# Patient Record
Sex: Male | Born: 1985
Health system: Southern US, Community
[De-identification: ages and names within clinical notes are randomized; demographics above are authoritative.]

## PROBLEM LIST (undated history)

## (undated) DIAGNOSIS — F988 Other specified behavioral and emotional disorders with onset usually occurring in childhood and adolescence: Secondary | ICD-10-CM

## (undated) DIAGNOSIS — D4959 Neoplasm of unspecified behavior of other genitourinary organ: Secondary | ICD-10-CM

## (undated) HISTORY — PX: CIRCUMCISION: SUR203

## (undated) HISTORY — PX: WISDOM TOOTH EXTRACTION: SHX21

---
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
## 2011-12-19 ENCOUNTER — Telehealth: Payer: Self-pay

## 2011-12-19 NOTE — Telephone Encounter (Signed)
NEEDS A REFILL ON HIS ADDERALL OR DOES HE NEED TO COME BACK IN FOR A RECHECK

## 2011-12-20 NOTE — Telephone Encounter (Signed)
He needs an OV  

## 2011-12-20 NOTE — Telephone Encounter (Signed)
Spoke to patient to advise he needs appt, he will call back tomorrow to schedule

## 2012-07-31 ENCOUNTER — Encounter: Payer: Self-pay | Admitting: Internal Medicine

## 2012-07-31 ENCOUNTER — Ambulatory Visit (INDEPENDENT_AMBULATORY_CARE_PROVIDER_SITE_OTHER): Payer: 59 | Admitting: Internal Medicine

## 2012-07-31 VITALS — BP 100/62 | HR 70 | Temp 98.3°F | Resp 16 | Ht 71.5 in | Wt 189.8 lb

## 2012-07-31 DIAGNOSIS — F988 Other specified behavioral and emotional disorders with onset usually occurring in childhood and adolescence: Secondary | ICD-10-CM | POA: Insufficient documentation

## 2012-07-31 MED ORDER — AMPHETAMINE-DEXTROAMPHETAMINE 20 MG PO TABS: 20.0000 mg | ORAL_TABLET | Freq: Every day | ORAL | Status: DC

## 2012-07-31 MED ORDER — AMPHETAMINE-DEXTROAMPHETAMINE 20 MG PO TABS: 20.0000 mg | ORAL_TABLET | Freq: Two times a day (BID) | ORAL | Status: DC

## 2012-07-31 NOTE — Addendum Note (Signed)
Addended by: Tonye Pearson on: 07/31/2012 04:40 PM   Modules accepted: Orders

## 2012-07-31 NOTE — Progress Notes (Signed)
  Subjective:    Patient ID: Christopher Swanson, male    DOB: 13-Nov-1985, 27 y.o.   MRN: [id number]  HPI    Review of Systems  Constitutional: Negative.   HENT: Negative.   Eyes: Negative.   Respiratory: Negative.   Cardiovascular: Negative.   Gastrointestinal: Negative.   Genitourinary: Negative.   Musculoskeletal: Negative.   Skin: Negative.   Neurological: Negative.   Hematological: Negative.   Psychiatric/Behavioral: Negative.        Objective:   Physical Exam        Assessment & Plan:

## 2012-07-31 NOTE — Progress Notes (Signed)
  Subjective:    Patient ID: Christopher Swanson, male    DOB: 08/05/1985, 27 y.o.   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 ADD (attention deficit disorder)  amphetamine-dextroamphetamine (ADDERALL) 20 MG tablet, amphetamine-dextroamphetamine (ADDERALL) 20 MG tablet, amphetamine-dextroamphetamine (ADDERALL) 20 MG tablet   Meds ordered this encounter  Medications  . amphetamine-dextroamphetamine (ADDERALL) 20 MG tablet    Sig: Take 1 tablet (20 mg total) by mouth daily.    Dispense:  30 tablet    Refill:  0  . amphetamine-dextroamphetamine (ADDERALL) 20 MG tablet    Sig: Take 1 tablet (20 mg total) by mouth 2 (two) times daily. For after 08/31/12    Dispense:  30 tablet    Refill:  0  . amphetamine-dextroamphetamine (ADDERALL) 20 MG tablet    Sig: Take 1 tablet (20 mg total) by mouth 2 (two) times daily. For after 09/28/12    Dispense:  30 tablet    Refill:  0   Call for 3 and f/u 6 mos

## 2012-07-31 NOTE — Progress Notes (Signed)
Completed prior auth for pt's Adderall 20 mg over the phone and received approval from 07/01/12-07/31/2013. Faxed approval notice to pharmacy.

## 2012-11-26 ENCOUNTER — Telehealth: Payer: Self-pay

## 2012-11-26 DIAGNOSIS — F988 Other specified behavioral and emotional disorders with onset usually occurring in childhood and adolescence: Secondary | ICD-10-CM

## 2012-11-26 MED ORDER — AMPHETAMINE-DEXTROAMPHETAMINE 20 MG PO TABS: 20.0000 mg | ORAL_TABLET | Freq: Every day | ORAL | Status: DC

## 2012-11-26 NOTE — Telephone Encounter (Signed)
Pended please advise.  

## 2012-11-26 NOTE — Telephone Encounter (Signed)
Ref Meds ordered this encounter  Medications  . amphetamine-dextroamphetamine (ADDERALL) 20 MG tablet    Sig: Take 1 tablet (20 mg total) by mouth daily.    Dispense:  30 tablet    Refill:  0  . amphetamine-dextroamphetamine (ADDERALL) 20 MG tablet    Sig: Take 1 tablet (20 mg total) by mouth daily. For after 12/29/12    Dispense:  30 tablet    Refill:  0

## 2012-11-26 NOTE — Telephone Encounter (Signed)
Patient has recently moved to [place] area.  He will be in [place] next week.  The patient would like to know if Dr.  Merla Riches can write up a couple of prescriptions to last him for a couple of months.

## 2012-11-27 NOTE — Telephone Encounter (Signed)
Patient advised.

## 2012-12-05 ENCOUNTER — Ambulatory Visit
Admission: RE | Admit: 2012-12-05 | Discharge: 2012-12-05 | Disposition: A | Discharge status: Home / Self Care | Payer: 59 | Source: Ambulatory Visit | Attending: Internal Medicine | Admitting: Internal Medicine

## 2012-12-05 ENCOUNTER — Other Ambulatory Visit: Payer: Self-pay | Admitting: Internal Medicine

## 2012-12-05 DIAGNOSIS — N509 Disorder of male genital organs, unspecified: Secondary | ICD-10-CM

## 2012-12-05 IMAGING — US US SCROTUM
1 series · 14 of 25 positions shown · non-contrast
Comparison: None.

CLINICAL DATA: Right testicular firmness.  Possible lump.

ULTRASOUND OF SCROTUM
TECHNIQUE: Complete ultrasound examination of the testicles,
epididymis, and other scrotal structures was performed.

[Series 1: us scrotum · 0.08mm/px · 14 of 49 slices shown]
[im 1/49]
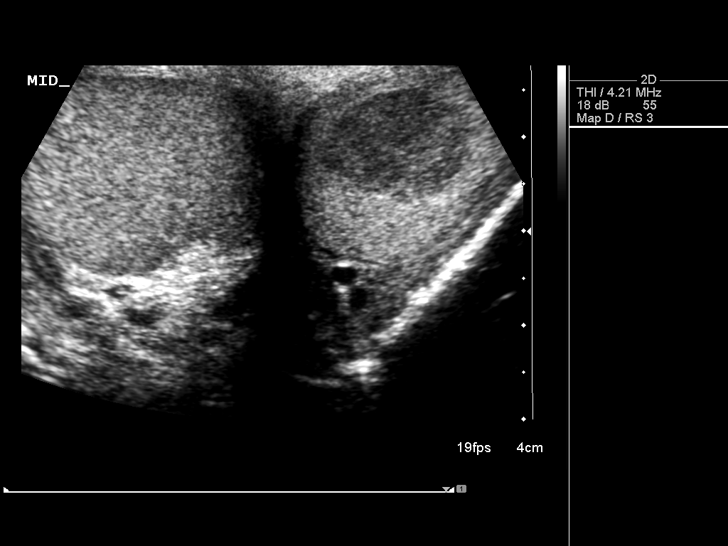
[im 5/49]
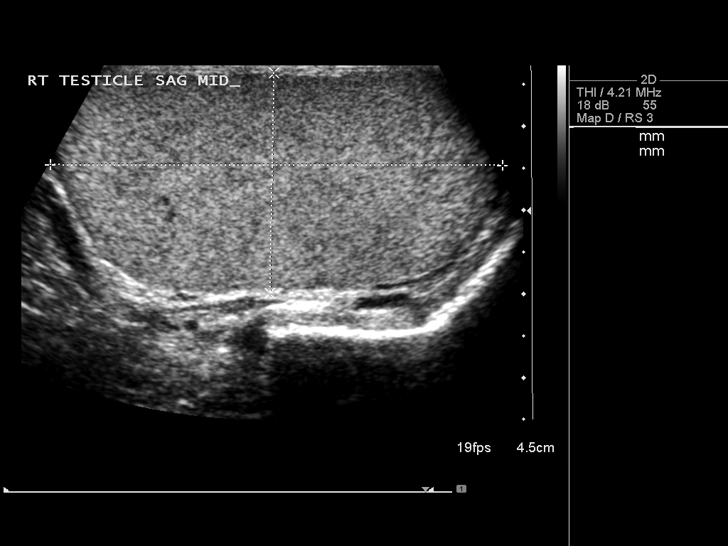
[im 9/49]
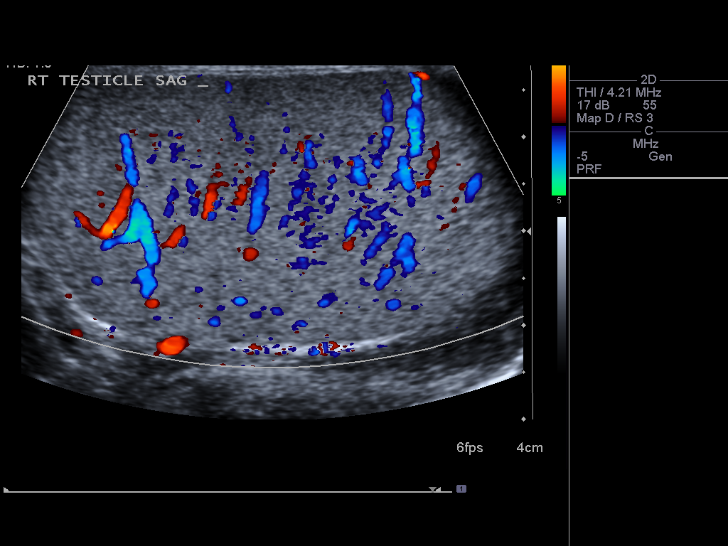
[im 13/49]
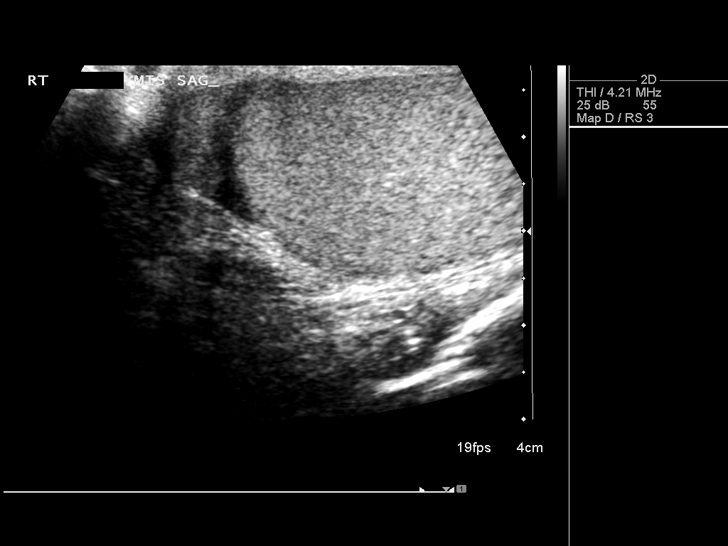
[im 17/49]
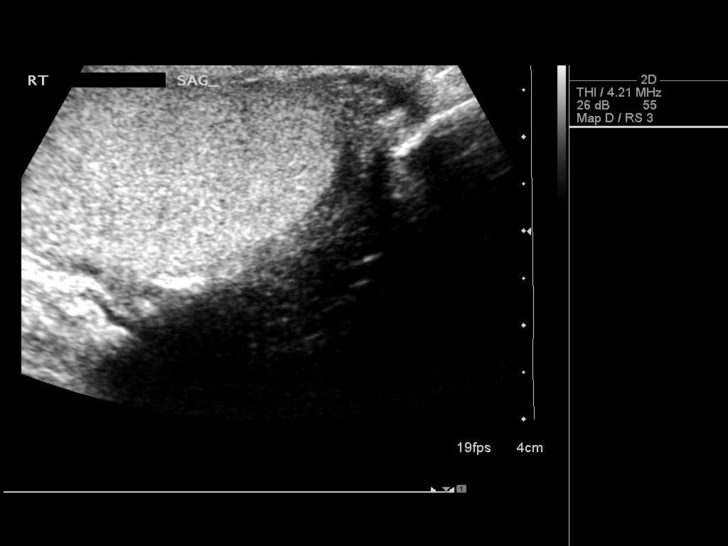
[im 19/49]
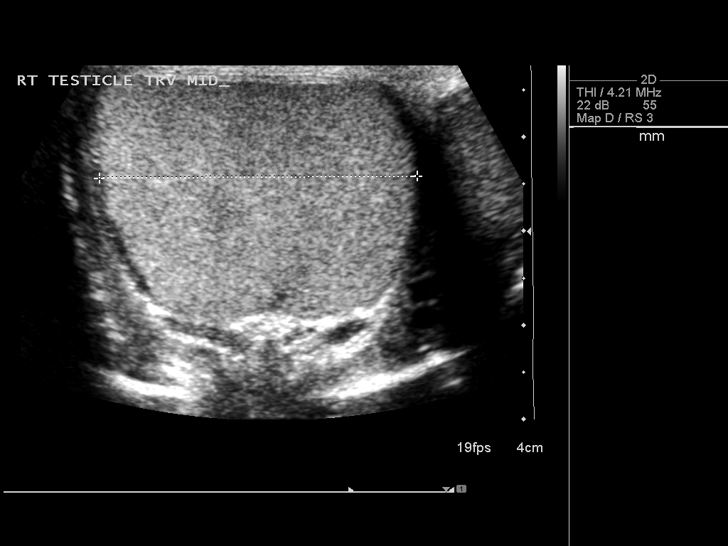
[im 23/49]
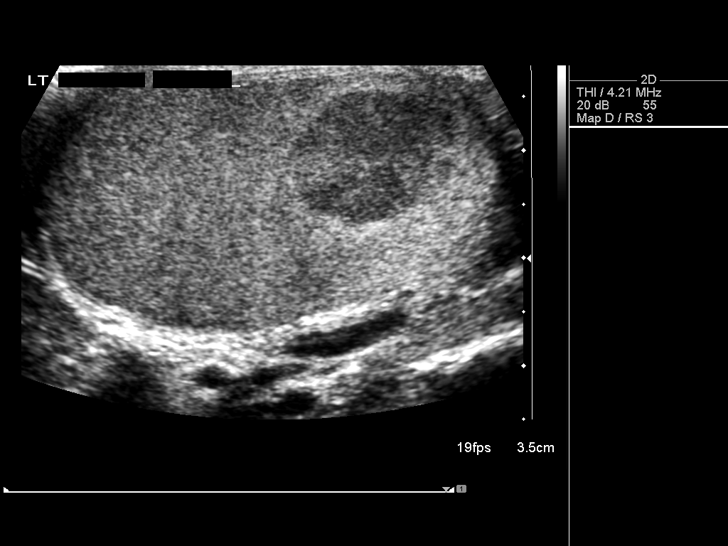
[im 27/49]
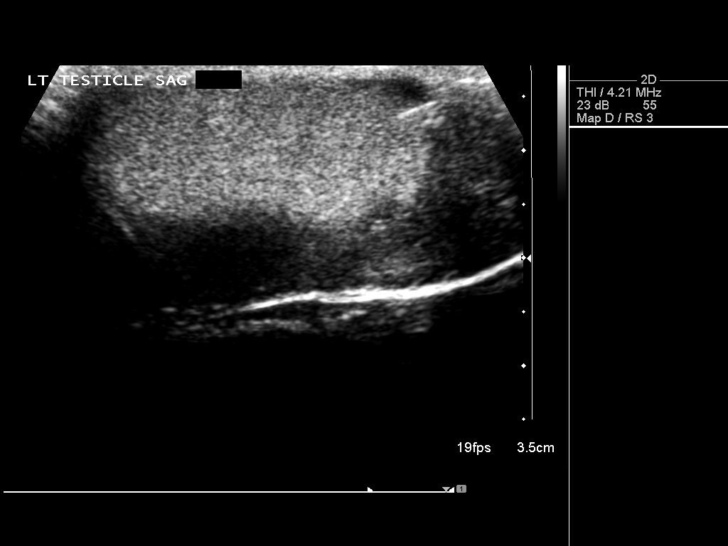
[im 31/49]
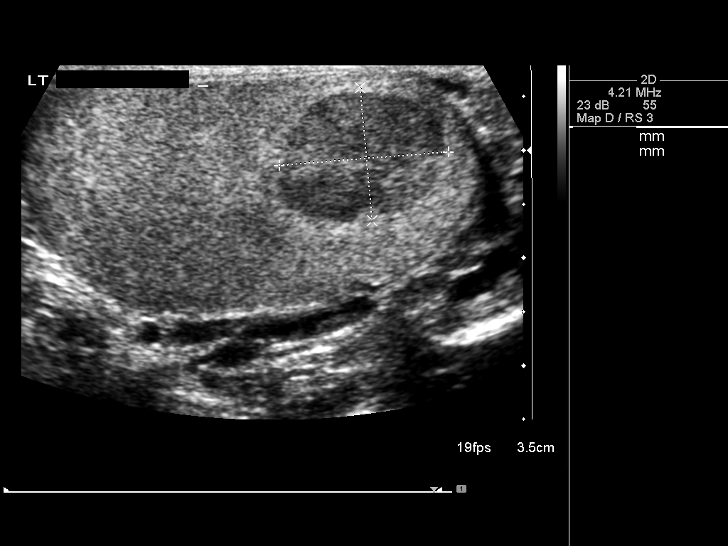
[im 33/49]
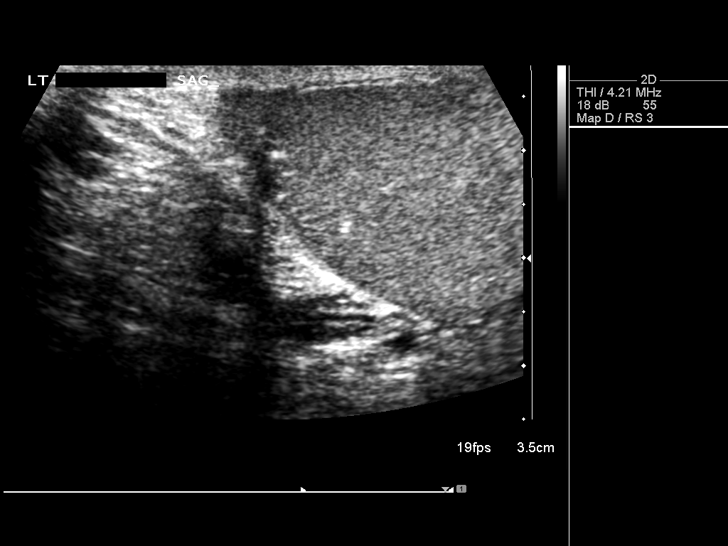
[im 37/49]
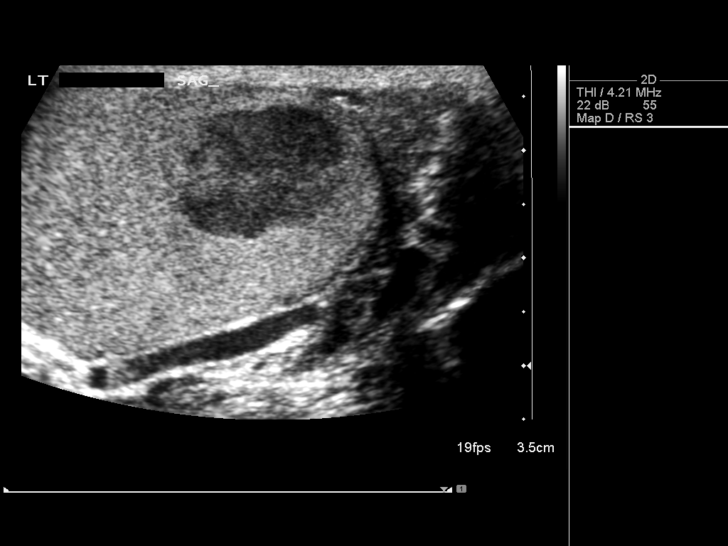
[im 41/49]
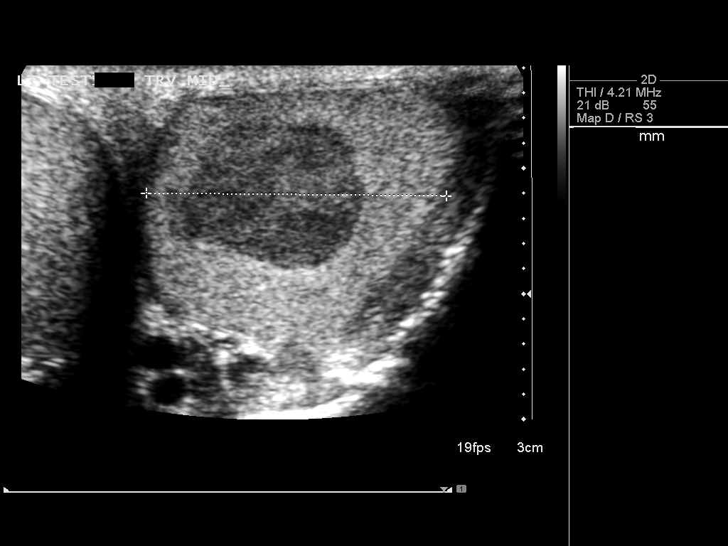
[im 45/49]
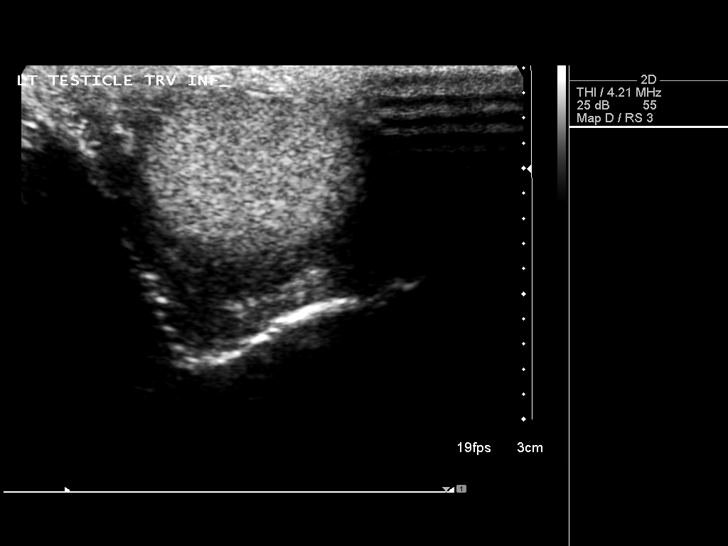
[im 49/49]
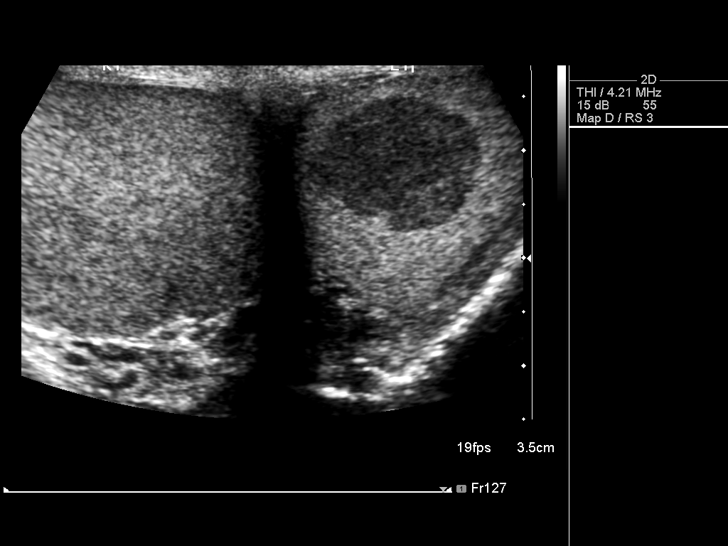

[14 of 25 positions shown; findings below may reference images not displayed]

FINDINGS: Right testis:  Measures 5.4 x 2.6 x 3.4 cm with expected internal
color flow

Left testis:  Measures 4.3 x 2.3 x 2.4 cm and contains an
intratesticular 1.6 x 1.2 x 1.5 cm hypoechoic solid mass with
hypervascularity.  There are some scattered calcifications along
the margin of the left testicle.

Right epididymis:  Normal in size and appearance.

Left epididymis:  Normal in size and appearance.

Hydrocele:  present/absent.

Varicocele:  present/absent.
IMPRESSION: 1. The right testicle is larger than left, and there is a sharply
defined hypervascular hypoechoic solid mass in the LEFT testicle.
Appearance concerning for potentially malignant left testicular
tumor.  Specific note is made that the lesion is in the left
testicle and not the right. Urology referral recommended for
potential orchiectomy.

## 2012-12-09 ENCOUNTER — Other Ambulatory Visit: Payer: Self-pay | Admitting: Urology

## 2012-12-10 ENCOUNTER — Encounter (HOSPITAL_BASED_OUTPATIENT_CLINIC_OR_DEPARTMENT_OTHER): Payer: Self-pay | Admitting: *Deleted

## 2012-12-10 NOTE — Progress Notes (Signed)
NPO AFTER MN. ARRIVES AT 0830. NEEDS HG. 

## 2012-12-13 NOTE — H&P (Signed)
History of Present Illness  [name]) presents today as a consultation request from Dr. Eula Listen for further assessment and treatment of a left testicular mass. He is currently 27 years of age. No other urologic history or issues. He was in recently to see Dr. Eula Listen for a small abnormality on his leg. He also reported questionable firm mass in his right testicle relative to his left. A scrotal ultrasound was performed which did not reveal any right-sided testicular abnormality but the patient clearly had a well-circumscribed approximately 1.6 cm abnormality in the left testis. This clearly showed hypervascularity it was quite concerning for primary germ cell tumor. No prior history of cryptorchidism.   Past Medical History Problems  1. History of  ADHD, Combined Type 314.01  Current Meds 1. Adderall 20 MG Oral Tablet; Therapy: (Recorded:02Jun2014) to  Allergies Medication  1. No Known Drug Allergies  Social History Problems    Alcohol Use   Marital History - Single   Occupation: Denied    History of  Tobacco Use  Review of Systems Genitourinary, constitutional, skin, eye, otolaryngeal, hematologic/lymphatic, cardiovascular, pulmonary, endocrine, musculoskeletal, gastrointestinal, neurological and psychiatric system(s) were reviewed and pertinent findings if present are noted.  Genitourinary: nocturia and difficulty starting the urinary stream.  Integumentary: skin rash/lesion and pruritus.  Hematologic/Lymphatic: a tendency to easily bruise.  Musculoskeletal: back pain.  Psychiatric: anxiety and depression.    Vitals Vital Signs [Data Includes: Last 1 Day]  02Jun2014 10:24AM  BMI Calculated: 25.73 BSA Calculated: 2.09 Height: 6 ft  Weight: 190 lb  Blood Pressure: 125 / 84 Temperature: 98.5 F Heart Rate: 62  Physical Exam Constitutional: Well nourished and well developed . No acute distress.  ENT:. The ears and nose are normal in appearance.  Neck: The  appearance of the neck is normal and no neck mass is present.  Pulmonary: No respiratory distress and normal respiratory rhythm and effort.  Cardiovascular: Heart rate and rhythm are normal . No peripheral edema.  Abdomen: The abdomen is soft and nontender. No masses are palpated. No CVA tenderness. No hernias are palpable. No hepatosplenomegaly noted.  Genitourinary: Examination of the penis demonstrates no discharge, no masses, no lesions and a normal meatus. The scrotum is without lesions. The right epididymis is palpably normal and non-tender. The left epididymis is palpably normal and non-tender. The right testis is non-tender and without masses. The left testis is found to have a 2 cm mass, but non-tender.  Skin: Normal skin turgor, no visible rash and no visible skin lesions.  Neuro/Psych:. Mood and affect are appropriate.    Results/Data Urine [Data Includes: Last 1 Day]   02Jun2014  COLOR YELLOW   APPEARANCE CLEAR   SPECIFIC GRAVITY 1.015   pH 8.5   GLUCOSE NEG mg/dL  BILIRUBIN NEG   KETONE NEG mg/dL  BLOOD NEG   PROTEIN NEG mg/dL  UROBILINOGEN 0.2 mg/dL  NITRITE NEG   LEUKOCYTE ESTERASE NEG    Assessment Assessed  1. Probable  Testicular Cancer 186.9  Plan Health Maintenance (V70.0)  1. UA With REFLEX  Done: 02Jun2014 10:02AM Testicular Cancer (186.9)  2. ALPHA-FETOPROTIEN (TUMOR MARKER)  Requested for: 02Jun2014 3. BETA HCG TUMOR MARKER  Requested for: 02Jun2014 4. LDH  Requested for: 02Jun2014 5. Follow-up Schedule Surgery Office  Follow-up  Requested for: 02Jun2014  Discussion/Summary   [name] actually presented to Dr. Donette Larry with a concerning palpable abnormality in the left testicle. Ultrasound confirms a well circumscribed 1.5 to 2 cm abnormality consistent with probable testicular neoplasm.  There is increased blood flow to this area. I suspect this is likely to be a seminoma but certainly very likely to be a germ cell tumor nonetheless. I have spoken to Rumeal and  his father today. I have recommended a left inguinal exploration/radical orchiectomy. Annie is interested in testicular prosthesis simultaneously, which is quite reasonable. Marker studies today to be drawn with alpha-fetoprotein, beta-hCG and LDH levels. Subsequent to surgery, if germ cell tumor is confirmed, we will plan on staging CT of the abdomen, pelvis, and chest. We will attempt to arrange surgery for the next 7-10 days.   cc: Dr. Donette Larry    Signatures Electronically signed by : Barron Alvine, M.D.; Dec 09 2012 12:54PM

## 2012-12-16 ENCOUNTER — Ambulatory Visit (HOSPITAL_BASED_OUTPATIENT_CLINIC_OR_DEPARTMENT_OTHER): Payer: 59 | Admitting: Anesthesiology

## 2012-12-16 ENCOUNTER — Encounter (HOSPITAL_BASED_OUTPATIENT_CLINIC_OR_DEPARTMENT_OTHER): Payer: Self-pay

## 2012-12-16 ENCOUNTER — Ambulatory Visit (HOSPITAL_BASED_OUTPATIENT_CLINIC_OR_DEPARTMENT_OTHER)
Admission: RE | Admit: 2012-12-16 | Discharge: 2012-12-16 | Disposition: A | Discharge status: Home / Self Care | Payer: 59 | Source: Ambulatory Visit | Attending: Urology | Admitting: Urology

## 2012-12-16 ENCOUNTER — Encounter (HOSPITAL_BASED_OUTPATIENT_CLINIC_OR_DEPARTMENT_OTHER)
Admission: RE | Disposition: A | Discharge status: Home / Self Care | Payer: Self-pay | Source: Ambulatory Visit | Attending: Urology

## 2012-12-16 ENCOUNTER — Encounter (HOSPITAL_BASED_OUTPATIENT_CLINIC_OR_DEPARTMENT_OTHER): Payer: Self-pay | Admitting: Anesthesiology

## 2012-12-16 DIAGNOSIS — Z79899 Other long term (current) drug therapy: Secondary | ICD-10-CM | POA: Insufficient documentation

## 2012-12-16 DIAGNOSIS — C6290 Malignant neoplasm of unspecified testis, unspecified whether descended or undescended: Secondary | ICD-10-CM | POA: Insufficient documentation

## 2012-12-16 DIAGNOSIS — F909 Attention-deficit hyperactivity disorder, unspecified type: Secondary | ICD-10-CM | POA: Insufficient documentation

## 2012-12-16 HISTORY — DX: Neoplasm of unspecified behavior of other genitourinary organ: D49.59

## 2012-12-16 HISTORY — PX: TESTICULAR PROSTHETIC INSERTION: SHX491

## 2012-12-16 HISTORY — DX: Other specified behavioral and emotional disorders with onset usually occurring in childhood and adolescence: F98.8

## 2012-12-16 HISTORY — PX: ORCHIECTOMY: SHX2116

## 2012-12-16 SURGERY — ORCHIECTOMY
Anesthesia: General | Site: Scrotum | Laterality: Left | Wound class: Clean

## 2012-12-16 MED ORDER — CEFAZOLIN SODIUM 1-5 GM-% IV SOLN
1.0000 g | INTRAVENOUS | Status: DC
  Filled 2012-12-16: qty 50

## 2012-12-16 MED ORDER — GLYCOPYRROLATE 0.2 MG/ML IJ SOLN
INTRAMUSCULAR | Status: DC | PRN
  Administered 2012-12-16 (×2): 0.2 mg via INTRAVENOUS

## 2012-12-16 MED ORDER — PROPOFOL 10 MG/ML IV BOLUS
INTRAVENOUS | Status: DC | PRN
  Administered 2012-12-16: 200 mg via INTRAVENOUS

## 2012-12-16 MED ORDER — ONDANSETRON HCL 4 MG/2ML IJ SOLN
INTRAMUSCULAR | Status: DC | PRN
  Administered 2012-12-16: 4 mg via INTRAVENOUS

## 2012-12-16 MED ORDER — CEFAZOLIN SODIUM-DEXTROSE 2-3 GM-% IV SOLR
2.0000 g | INTRAVENOUS | Status: AC
  Administered 2012-12-16: 2 g via INTRAVENOUS
  Filled 2012-12-16: qty 50

## 2012-12-16 MED ORDER — SODIUM CHLORIDE 0.9 % IR SOLN
Status: DC | PRN
  Administered 2012-12-16: 10:00:00

## 2012-12-16 MED ORDER — DEXAMETHASONE SODIUM PHOSPHATE 4 MG/ML IJ SOLN
INTRAMUSCULAR | Status: DC | PRN
  Administered 2012-12-16: 8 mg via INTRAVENOUS

## 2012-12-16 MED ORDER — BUPIVACAINE HCL (PF) 0.25 % IJ SOLN
INTRAMUSCULAR | Status: DC | PRN
  Administered 2012-12-16: 16 mL

## 2012-12-16 MED ORDER — FENTANYL CITRATE 0.05 MG/ML IJ SOLN
INTRAMUSCULAR | Status: DC | PRN
  Administered 2012-12-16: 25 ug via INTRAVENOUS
  Administered 2012-12-16 (×3): 50 ug via INTRAVENOUS
  Administered 2012-12-16: 25 ug via INTRAVENOUS

## 2012-12-16 MED ORDER — HYDROCODONE-ACETAMINOPHEN 5-325 MG PO TABS: 1.0000 | ORAL_TABLET | Freq: Four times a day (QID) | ORAL | Status: DC | PRN

## 2012-12-16 MED ORDER — CEPHALEXIN 250 MG PO CAPS: 250.0000 mg | ORAL_CAPSULE | Freq: Three times a day (TID) | ORAL | Status: DC

## 2012-12-16 MED ORDER — LIDOCAINE HCL (CARDIAC) 20 MG/ML IV SOLN
INTRAVENOUS | Status: DC | PRN
  Administered 2012-12-16: 50 mg via INTRAVENOUS

## 2012-12-16 MED ORDER — FENTANYL CITRATE 0.05 MG/ML IJ SOLN
25.0000 ug | INTRAMUSCULAR | Status: DC | PRN
  Administered 2012-12-16 (×2): 25 ug via INTRAVENOUS
  Filled 2012-12-16: qty 1

## 2012-12-16 MED ORDER — LACTATED RINGERS IV SOLN
INTRAVENOUS | Status: DC
  Administered 2012-12-16 (×2): via INTRAVENOUS
  Filled 2012-12-16: qty 1000

## 2012-12-16 MED ORDER — LACTATED RINGERS IV SOLN
INTRAVENOUS | Status: DC
  Filled 2012-12-16: qty 1000

## 2012-12-16 MED ORDER — MIDAZOLAM HCL 5 MG/5ML IJ SOLN
INTRAMUSCULAR | Status: DC | PRN
  Administered 2012-12-16: 2 mg via INTRAVENOUS

## 2012-12-16 MED ORDER — HYDROCODONE-ACETAMINOPHEN 5-325 MG PO TABS
1.0000 | ORAL_TABLET | Freq: Four times a day (QID) | ORAL | Status: AC | PRN
  Administered 2012-12-16: 1 via ORAL
  Filled 2012-12-16: qty 1

## 2012-12-16 SURGICAL SUPPLY — 62 items
ADH SKN CLS APL DERMABOND .7 (GAUZE/BANDAGES/DRESSINGS) ×2
APL SKNCLS STERI-STRIP NONHPOA (GAUZE/BANDAGES/DRESSINGS) ×4
BANDAGE GAUZE ELAST BULKY 4 IN (GAUZE/BANDAGES/DRESSINGS) ×3 IMPLANT
BENZOIN TINCTURE PRP APPL 2/3 (GAUZE/BANDAGES/DRESSINGS) ×2 IMPLANT
BLADE SURG 15 STRL LF DISP TIS (BLADE) ×2 IMPLANT
BLADE SURG 15 STRL SS (BLADE) ×3
BLADE SURG ROTATE 9660 (MISCELLANEOUS) ×3 IMPLANT
CANISTER SUCTION 2500CC (MISCELLANEOUS) ×1 IMPLANT
CLEANER CAUTERY TIP 5X5 PAD (MISCELLANEOUS) ×2 IMPLANT
CLOTH BEACON ORANGE TIMEOUT ST (SAFETY) ×3 IMPLANT
COVER MAYO STAND STRL (DRAPES) ×3 IMPLANT
COVER TABLE BACK 60X90 (DRAPES) ×3 IMPLANT
DERMABOND ADVANCED (GAUZE/BANDAGES/DRESSINGS) ×1
DERMABOND ADVANCED .7 DNX12 (GAUZE/BANDAGES/DRESSINGS) IMPLANT
DISSECTOR ROUND CHERRY 3/8 STR (MISCELLANEOUS) ×1 IMPLANT
DRAIN PENROSE 18X1/4 LTX STRL (WOUND CARE) ×1 IMPLANT
DRAPE LAPAROTOMY TRNSV 102X78 (DRAPE) IMPLANT
DRAPE PED LAPAROTOMY (DRAPES) ×3 IMPLANT
DRSG TEGADERM 4X4.75 (GAUZE/BANDAGES/DRESSINGS) ×1 IMPLANT
ELECT NDL TIP 2.8 STRL (NEEDLE) ×2 IMPLANT
ELECT NEEDLE TIP 2.8 STRL (NEEDLE) ×3 IMPLANT
ELECT REM PT RETURN 9FT ADLT (ELECTROSURGICAL) ×3
ELECTRODE REM PT RTRN 9FT ADLT (ELECTROSURGICAL) ×2 IMPLANT
GLOVE BIO SURGEON STRL SZ 6.5 (GLOVE) ×1 IMPLANT
GLOVE BIO SURGEON STRL SZ7.5 (GLOVE) ×4 IMPLANT
GLOVE ECLIPSE 6.5 STRL STRAW (GLOVE) ×1 IMPLANT
GLOVE ECLIPSE 7.0 STRL STRAW (GLOVE) ×1 IMPLANT
GLOVE INDICATOR 7.0 STRL GRN (GLOVE) ×1 IMPLANT
GOWN PREVENTION PLUS LG XLONG (DISPOSABLE) ×3 IMPLANT
GOWN STRL NON-REIN LRG LVL3 (GOWN DISPOSABLE) ×2 IMPLANT
NDL HYPO 25X1 1.5 SAFETY (NEEDLE) ×2 IMPLANT
NEEDLE HYPO 25X1 1.5 SAFETY (NEEDLE) ×3 IMPLANT
NS IRRIG 500ML POUR BTL (IV SOLUTION) IMPLANT
PACK BASIN DAY SURGERY FS (CUSTOM PROCEDURE TRAY) ×3 IMPLANT
PAD CLEANER CAUTERY TIP 5X5 (MISCELLANEOUS) ×1
PENCIL BUTTON HOLSTER BLD 10FT (ELECTRODE) ×3 IMPLANT
PROSTHESIS TESTICULAR NAC MED (Urological Implant) IMPLANT
STRIP CLOSURE SKIN 1/4X4 (GAUZE/BANDAGES/DRESSINGS) ×2 IMPLANT
SUPPORT SCROTAL LG STRP (MISCELLANEOUS) ×3 IMPLANT
SUT SILK 0 SH 30 (SUTURE) ×6 IMPLANT
SUT SILK 0 TIES 10X30 (SUTURE) ×3 IMPLANT
SUT VIC AB 2-0 CT1 27 (SUTURE)
SUT VIC AB 2-0 CT1 TAPERPNT 27 (SUTURE) IMPLANT
SUT VIC AB 3-0 CT1 36 (SUTURE) IMPLANT
SUT VIC AB 3-0 SH 27 (SUTURE) ×6
SUT VIC AB 3-0 SH 27X BRD (SUTURE) ×4 IMPLANT
SUT VIC AB 4-0 BRD 54 (SUTURE) ×1 IMPLANT
SUT VIC AB 4-0 P-3 18XBRD (SUTURE) IMPLANT
SUT VIC AB 4-0 P3 18 (SUTURE)
SUT VIC AB 4-0 RB1 18 (SUTURE) IMPLANT
SUT VIC AB 5-0 P-3 18X BRD (SUTURE) IMPLANT
SUT VIC AB 5-0 P3 18 (SUTURE)
SUT VICRYL 4-0 PS2 18IN ABS (SUTURE) ×3 IMPLANT
SYR BULB IRRIGATION 50ML (SYRINGE) ×1 IMPLANT
SYR CONTROL 10ML LL (SYRINGE) ×3 IMPLANT
TESTICULAR PROSTHESIS NAC MED (Urological Implant) ×3 IMPLANT
TOWEL OR 17X24 6PK STRL BLUE (TOWEL DISPOSABLE) ×6 IMPLANT
TRAY DSU PREP LF (CUSTOM PROCEDURE TRAY) ×3 IMPLANT
TUBE CONNECTING 12X1/4 (SUCTIONS) ×1 IMPLANT
TUBING SUCTION BULK 100 FT (MISCELLANEOUS) IMPLANT
WATER STERILE IRR 500ML POUR (IV SOLUTION) IMPLANT
YANKAUER SUCT BULB TIP NO VENT (SUCTIONS) ×1 IMPLANT

## 2012-12-16 NOTE — Interval H&P Note (Signed)
History and Physical Interval Note:  12/16/2012 9:40 AM  Christopher Swanson  has presented today for surgery, with the diagnosis of LEFT TESTICULAR TUMOR  The various methods of treatment have been discussed with the patient and family. After consideration of risks, benefits and other options for treatment, the patient has consented to  Procedure(s) with comments: ORCHIECTOMY (Left) - 1 HR  INSERTION TESTICULAR PROSTHESIS (Left) as a surgical intervention .  The patient's history has been reviewed, patient examined, no change in status, stable for surgery.  I have reviewed the patient's chart and labs.  Questions were answered to the patient's satisfaction.     Melaysia Streed S

## 2012-12-16 NOTE — Anesthesia Preprocedure Evaluation (Signed)

## 2012-12-16 NOTE — Anesthesia Postprocedure Evaluation (Signed)
  Anesthesia Post-op Note  Patient: Christopher Swanson  Procedure(s) Performed: Procedure(s) (LRB): ORCHIECTOMY (Left) INSERTION TESTICULAR PROSTHESIS (Left)  Patient Location: PACU  Anesthesia Type: General  Level of Consciousness: awake and alert   Airway and Oxygen Therapy: Patient Spontanous Breathing  Post-op Pain: mild  Post-op Assessment: Post-op Vital signs reviewed, Patient's Cardiovascular Status Stable, Respiratory Function Stable, Patent Airway and No signs of Nausea or vomiting  Last Vitals:  Filed Vitals:   12/16/12 1215  BP: 121/76  Pulse: 82  Temp:   Resp: 13    Post-op Vital Signs: stable   Complications: No apparent anesthesia complications

## 2012-12-16 NOTE — Transfer of Care (Signed)
Immediate Anesthesia Transfer of Care Note  Patient: Christopher Swanson  Procedure(s) Performed: Procedure(s) (LRB): ORCHIECTOMY (Left) INSERTION TESTICULAR PROSTHESIS (Left)  Patient Location: PACU  Anesthesia Type: General  Level of Consciousness: awake, oriented, sedated and patient cooperative  Airway & Oxygen Therapy: Patient Spontanous Breathing and Patient connected to face mask oxygen  Post-op Assessment: Report given to PACU RN and Post -op Vital signs reviewed and stable  Post vital signs: Reviewed and stable  Complications: No apparent anesthesia complications

## 2012-12-16 NOTE — Op Note (Signed)
Preoperative diagnosis: Left testicular tumor Postoperative diagnosis: Same  Procedure: Left radical orchiectomy   Surgeon: Valetta Fuller M.D.  Anesthesia: Gen.  Indications: Christopher Swanson is 27 years of age and presented recently to his primary care physician with a questionable abnormality in the left testicle. Ultrasound was performed which confirmed approximately a 1.5 cm hypoechoic abnormality that was hypervascular consistent with a testicular cancer. This was palpable on clinical exam. All markers were negative. The patient has not had any CT imaging at this point. The rationale for inguinal radical orchiectomy was discussed with the patient and his father at the time of consultation. Full informed consent obtained. The patient did request testicular prosthesis.     Technique and findings: Patient was brought the operating room where he had successful induction of general anesthesia. He was prepped and draped in the usual manner. He received perioperative Ancef and placement of PAS compression boots. A standard operative timeout was performed. Left inguinal incision was performed. As carried down to the superficial fascia to the external oblique. The external ring was identified and the external oblique fascia then opened in the direction of the fibers. Underlying ilioinguinal nerve was identified and preserved. The spermatic cord was encircled at the level of the pubic tubercle and a Penrose drain placed around the spermatic cord. This was then dissected free from the floor of the inguinal canal. A tourniquet was then placed on the proximal spermatic cord. The testis was then delivered and gubernacular attachments were taken down with electrocautery. A palpable mass in the inferior pole of the testis was identified. There is no evidence of tumor breaking out through that tunica vaginalis. The spermatic cord was then taken in 2 bundles at the level of the internal range. Silk suture and silk suture ligature  were utilized for double ligation proximally in both bundles. The wound was then copiously irrigated. The wound was infiltrated with quarter percent Marcaine.   Attention was then turned to placement of a medium saline filled testicular prosthesis. This was filled and the standard manner. It was anchored to the inferior aspect of the scrotum utilizing a single Prolene suture. Again copious irrigation was performed. External oblique was then closed with running 2-0 Vicryl suture. Superficial fascia closed with 3-0 Vicryl the skin closed with a subcuticular 4-0 Vicryl. The patient had no obvious complications or problems. Blood loss was minimal. He was brought to recovery room in stable condition.

## 2012-12-16 NOTE — Anesthesia Procedure Notes (Signed)
Procedure Name: LMA Insertion Date/Time: 12/16/2012 9:48 AM Performed by: Renella Cunas D Pre-anesthesia Checklist: Patient identified, Emergency Drugs available, Suction available and Patient being monitored Patient Re-evaluated:Patient Re-evaluated prior to inductionOxygen Delivery Method: Circle System Utilized Preoxygenation: Pre-oxygenation with 100% oxygen Intubation Type: IV induction Ventilation: Mask ventilation without difficulty LMA: LMA inserted LMA Size: 4.0 Number of attempts: 1 Airway Equipment and Method: bite block Placement Confirmation: positive ETCO2 Tube secured with: Tape Dental Injury: Teeth and Oropharynx as per pre-operative assessment

## 2012-12-17 ENCOUNTER — Encounter (HOSPITAL_BASED_OUTPATIENT_CLINIC_OR_DEPARTMENT_OTHER): Payer: Self-pay | Admitting: Urology

## 2013-01-12 ENCOUNTER — Other Ambulatory Visit: Payer: Self-pay | Admitting: Radiology

## 2013-01-12 MED ORDER — AMPHETAMINE-DEXTROAMPHETAMINE 20 MG PO TABS: 20.0000 mg | ORAL_TABLET | Freq: Every day | ORAL | Status: DC

## 2013-01-12 MED ORDER — AMPHETAMINE-DEXTROAMPHETAMINE 20 MG PO TABS: 20.0000 mg | ORAL_TABLET | ORAL | Status: DC | PRN

## 2013-01-12 NOTE — Telephone Encounter (Signed)
Pt came in office to pick up rx-nothing was located in the box nor was their a signature documented for pick up. Discussed with Dr Stephanie Acre checked the database and ok'd for Adderall per [facility]'s notes. Pt picked up rx today.

## 2013-02-04 ENCOUNTER — Ambulatory Visit: Payer: 59 | Admitting: Physical Therapy

## 2013-02-05 ENCOUNTER — Ambulatory Visit: Payer: 59 | Attending: Nurse Practitioner

## 2013-02-05 DIAGNOSIS — M545 Low back pain, unspecified: Secondary | ICD-10-CM | POA: Insufficient documentation

## 2013-02-05 DIAGNOSIS — IMO0001 Reserved for inherently not codable concepts without codable children: Secondary | ICD-10-CM | POA: Insufficient documentation

## 2013-02-11 ENCOUNTER — Ambulatory Visit: Payer: 59 | Attending: Nurse Practitioner

## 2013-02-11 DIAGNOSIS — M545 Low back pain, unspecified: Secondary | ICD-10-CM | POA: Insufficient documentation

## 2013-02-11 DIAGNOSIS — IMO0001 Reserved for inherently not codable concepts without codable children: Secondary | ICD-10-CM | POA: Insufficient documentation

## 2013-02-18 ENCOUNTER — Ambulatory Visit: Payer: 59

## 2013-02-19 ENCOUNTER — Ambulatory Visit (INDEPENDENT_AMBULATORY_CARE_PROVIDER_SITE_OTHER): Payer: 59 | Admitting: Internal Medicine

## 2013-02-19 ENCOUNTER — Encounter: Payer: Self-pay | Admitting: Internal Medicine

## 2013-02-19 VITALS — BP 123/74 | HR 68 | Temp 98.3°F | Resp 16 | Ht 72.5 in | Wt 191.8 lb

## 2013-02-19 DIAGNOSIS — F988 Other specified behavioral and emotional disorders with onset usually occurring in childhood and adolescence: Secondary | ICD-10-CM

## 2013-02-19 MED ORDER — AMPHETAMINE-DEXTROAMPHETAMINE 10 MG PO TABS: 15.0000 mg | ORAL_TABLET | Freq: Every day | ORAL | Status: DC

## 2013-02-19 NOTE — Progress Notes (Signed)
  Subjective:    Patient ID: Christopher Swanson, male    DOB: 1985-11-04, 27 y.o.   MRN: [id number]  HPI here for followup for attention deficit disorder  Patient interested in decreasing Adderall, hoping to wean off in the next year. 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 Does not feel the need to use this medication for work duties. This was expected due to the mild nature of his attention deficit disorder  Had testicular cancer with surgery this summer by Dr. Isabel Caprice. No evidence of metastatics disease. Has f/u in one week to check hormone levels.  Has good support system in girlfriend. Has new job with [employer] in [place]. Having change in insurance and will transfer care to Dr. Rene Paci who is in his network, requests record transfer.     Continues to be healthy despite testicular cancer and is now resuming his exercise pattern  Review of Systems Essentially negative    Objective:   Physical Exam BP 123/74  Pulse 68  Temp(Src) 98.3 F (36.8 C) (Oral)  Resp 16  Ht 6' 0.5" (1.842 m)  Wt 191 lb 12.8 oz (87 kg)  BMI 25.64 kg/m2  SpO2 97% HEENT clear Heart regular Cranial nerve II through XII intact Cerebellar intact Mood good/affect appropriate      Assessment & Plan:  ADD- will decrease Adderall gradually as tolerated until discontinued over the next few months  Meds ordered this encounter  Medications  . amphetamine-dextroamphetamine (ADDERALL) 10 MG tablet    Sig: Take 1.5 tablets (15 mg total) by mouth daily.    Dispense:  45 tablet    Refill:  0  . amphetamine-dextroamphetamine (ADDERALL) 10 MG tablet    Sig: Take 1.5 tablets (15 mg total) by mouth daily.    Dispense:  45 tablet    Refill:  0  . amphetamine-dextroamphetamine (ADDERALL) 10 MG tablet    Sig: Take 1.5 tablets (15 mg total) by mouth daily.    Dispense:  45 tablet    Refill:  0   To Start routine care with  Dr. Rene Paci

## 2013-02-19 NOTE — Progress Notes (Deleted)
  Subjective:    Patient ID: Christopher Swanson, male    DOB: 01-Sep-1985, 27 y.o.   MRN: [id number]  HPI    Review of Systems     Objective:   Physical Exam        Assessment & Plan:  Notes to Dr Rene Paci

## 2013-02-25 ENCOUNTER — Ambulatory Visit: Payer: 59

## 2013-03-04 ENCOUNTER — Ambulatory Visit: Payer: 59

## 2013-03-11 ENCOUNTER — Ambulatory Visit: Payer: BC Managed Care – PPO | Attending: Nurse Practitioner

## 2013-03-11 DIAGNOSIS — M545 Low back pain, unspecified: Secondary | ICD-10-CM | POA: Insufficient documentation

## 2013-03-11 DIAGNOSIS — IMO0001 Reserved for inherently not codable concepts without codable children: Secondary | ICD-10-CM | POA: Insufficient documentation

## 2013-08-27 ENCOUNTER — Ambulatory Visit
Admission: RE | Admit: 2013-08-27 | Discharge: 2013-08-27 | Disposition: A | Discharge status: Home / Self Care | Payer: BC Managed Care – PPO | Source: Ambulatory Visit | Attending: Urology | Admitting: Urology

## 2013-08-27 ENCOUNTER — Other Ambulatory Visit: Payer: Self-pay | Admitting: Urology

## 2013-08-27 DIAGNOSIS — Z8547 Personal history of malignant neoplasm of testis: Secondary | ICD-10-CM

## 2013-08-27 IMAGING — CR DG CHEST 2V
2 series · 2 of 2 positions shown · non-contrast
Comparison: None.

CLINICAL DATA: Testicular cancer

EXAM:
CHEST  2 VIEW

[w chest pa]
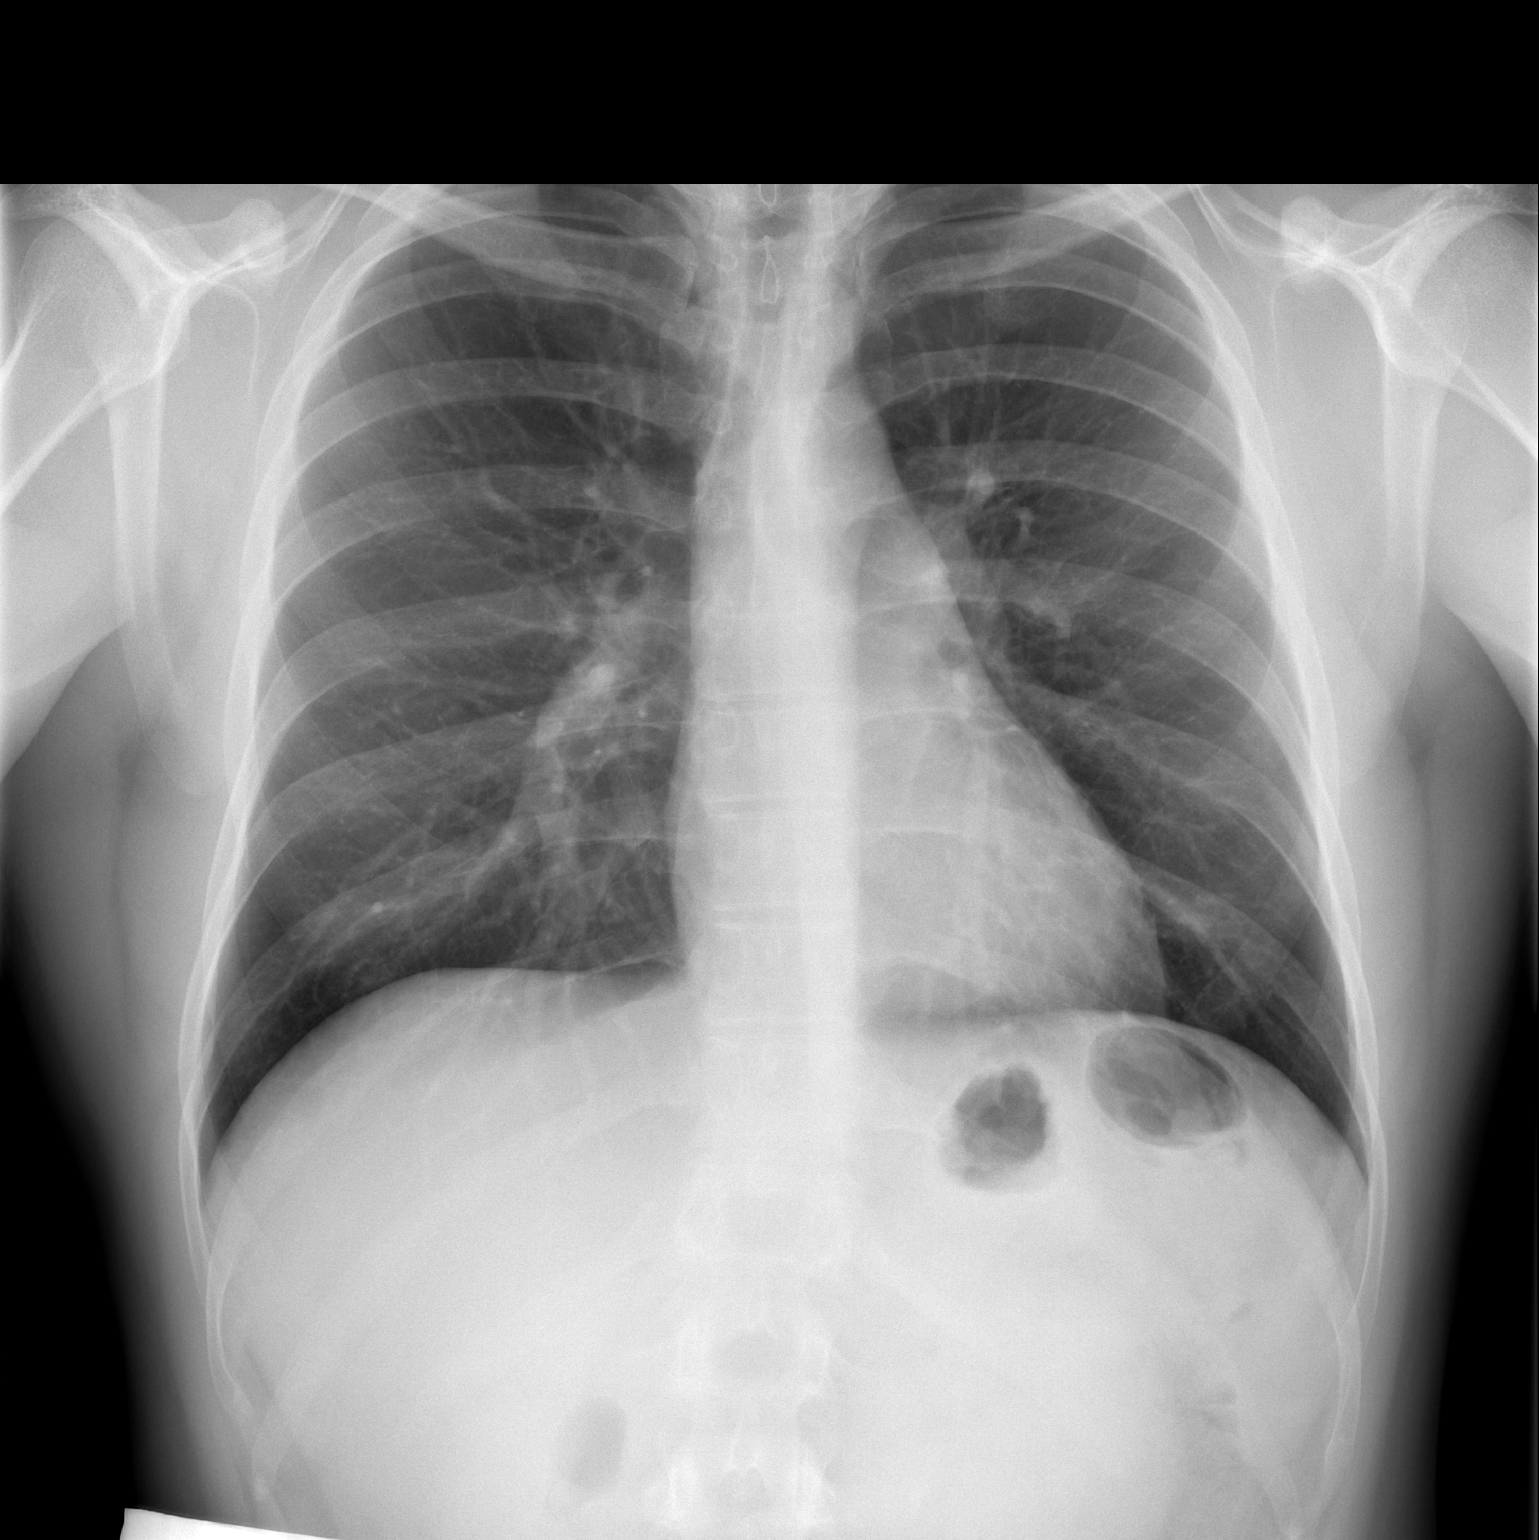

[w chest lat]
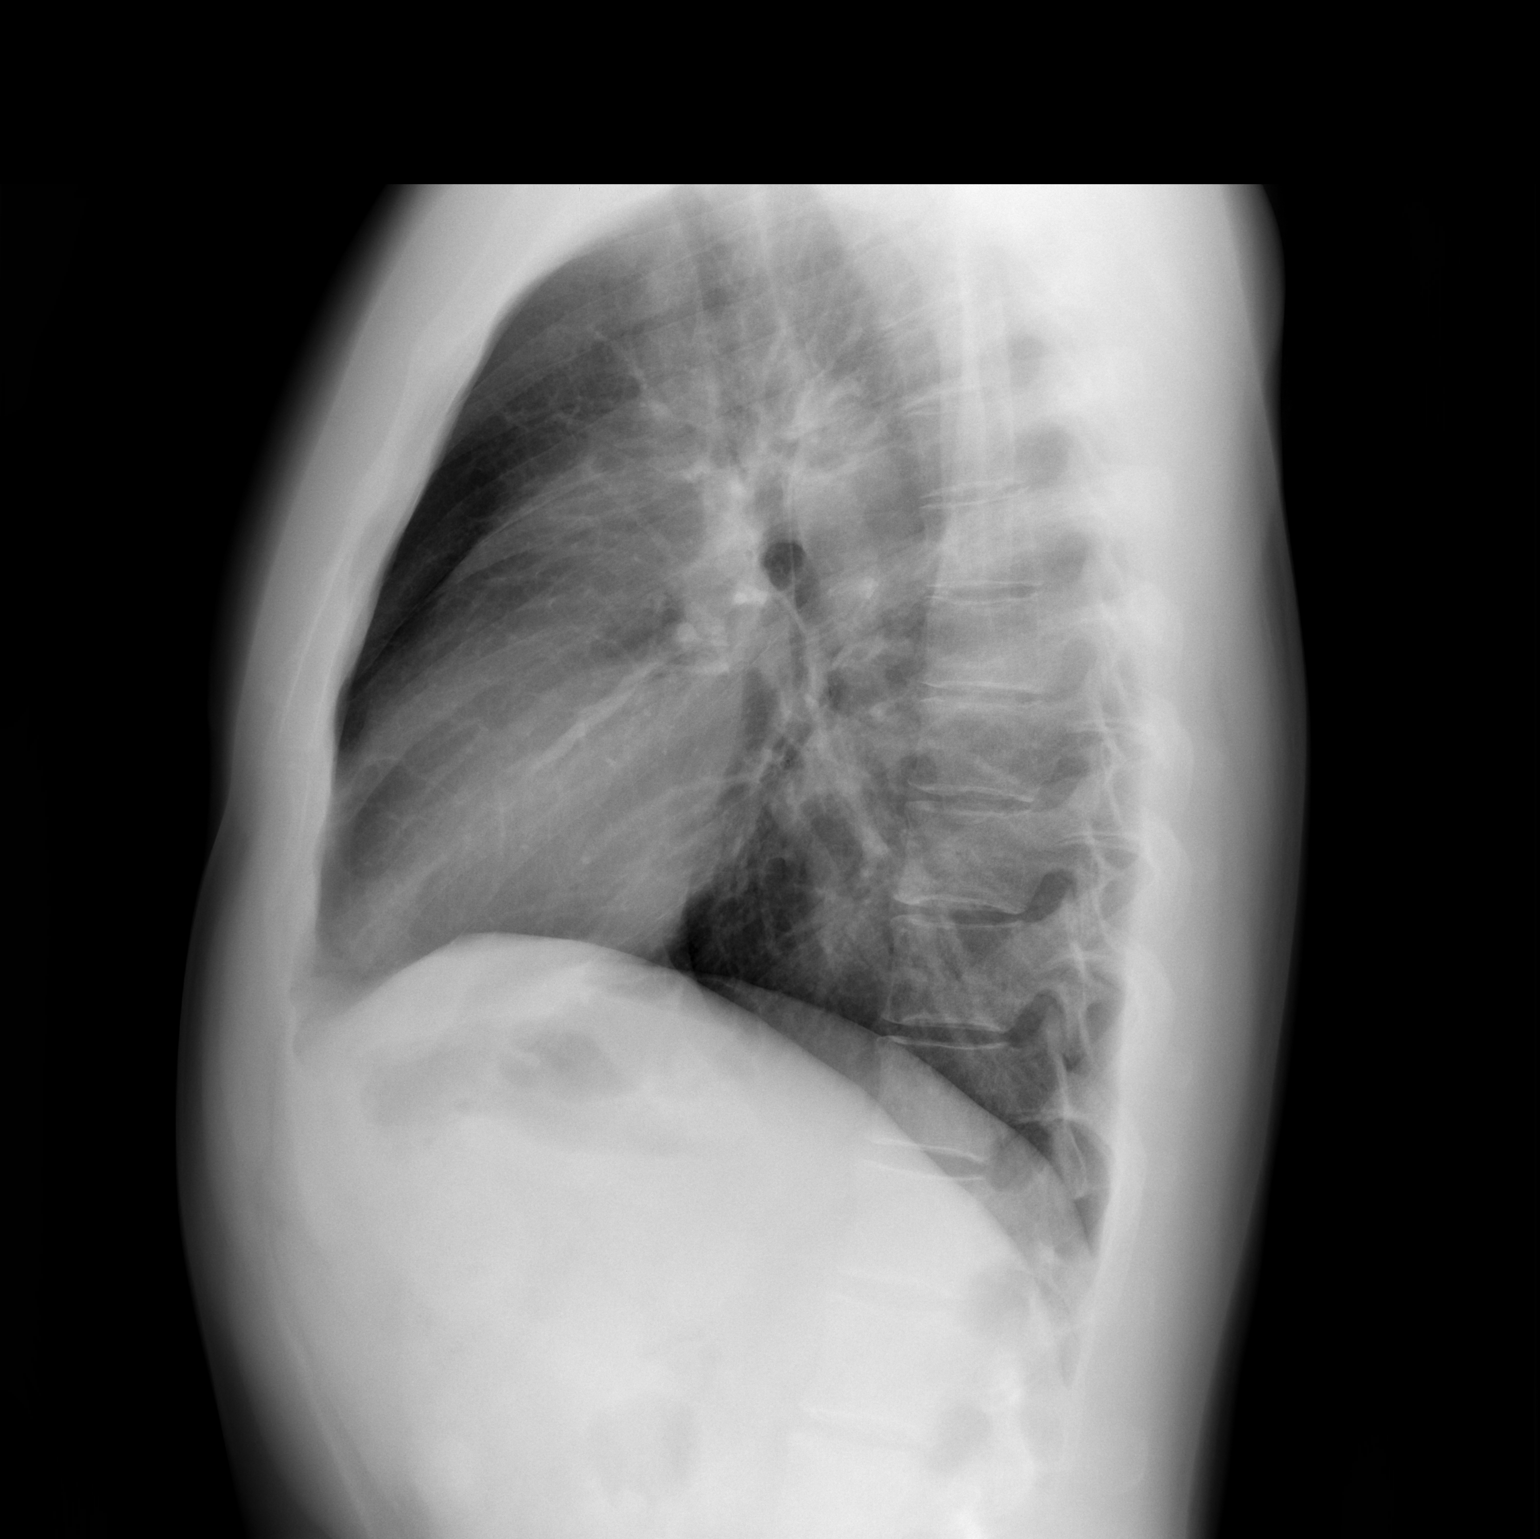

[2 of 2 positions shown; findings below may reference images not displayed]

FINDINGS: The heart size and mediastinal contours are within normal limits.
Both lungs are clear. The visualized skeletal structures are
unremarkable. No lung nodule identified.
IMPRESSION: No active cardiopulmonary disease.

## 2013-09-19 ENCOUNTER — Other Ambulatory Visit: Payer: Self-pay | Admitting: Dermatology

## 2015-02-16 ENCOUNTER — Encounter: Payer: Self-pay | Admitting: Sports Medicine

## 2015-02-16 ENCOUNTER — Ambulatory Visit (INDEPENDENT_AMBULATORY_CARE_PROVIDER_SITE_OTHER): Payer: BLUE CROSS/BLUE SHIELD

## 2015-02-16 ENCOUNTER — Ambulatory Visit (INDEPENDENT_AMBULATORY_CARE_PROVIDER_SITE_OTHER): Payer: BLUE CROSS/BLUE SHIELD | Admitting: Sports Medicine

## 2015-02-16 VITALS — BP 118/78 | HR 61 | Ht 72.0 in | Wt 180.0 lb

## 2015-02-16 DIAGNOSIS — M4806 Spinal stenosis, lumbar region: Secondary | ICD-10-CM

## 2015-02-16 DIAGNOSIS — M5136 Other intervertebral disc degeneration, lumbar region: Secondary | ICD-10-CM

## 2015-02-16 DIAGNOSIS — M51369 Other intervertebral disc degeneration, lumbar region without mention of lumbar back pain or lower extremity pain: Secondary | ICD-10-CM | POA: Insufficient documentation

## 2015-02-16 IMAGING — CR DG LUMBAR SPINE COMPLETE 4+V
5 series · 5 of 5 positions shown · non-contrast
Comparison: CT abdomen pelvis [DATE]

CLINICAL DATA: Low back pain.  Lumbar disc degeneration

EXAM:
LUMBAR SPINE - COMPLETE 4+ VIEW

[l-spine ap]
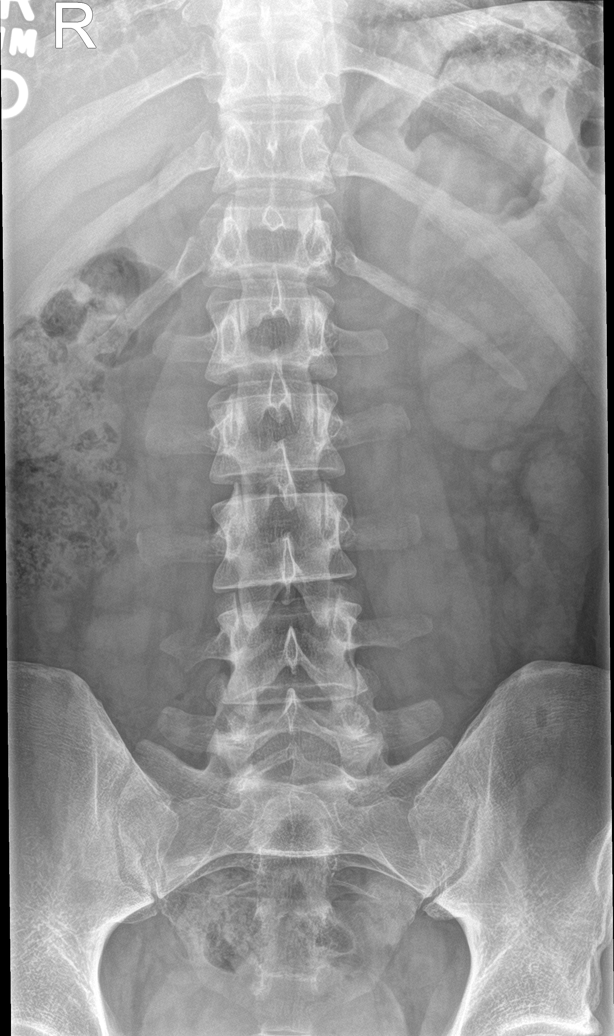

[l-spine obl (1 of 2)]
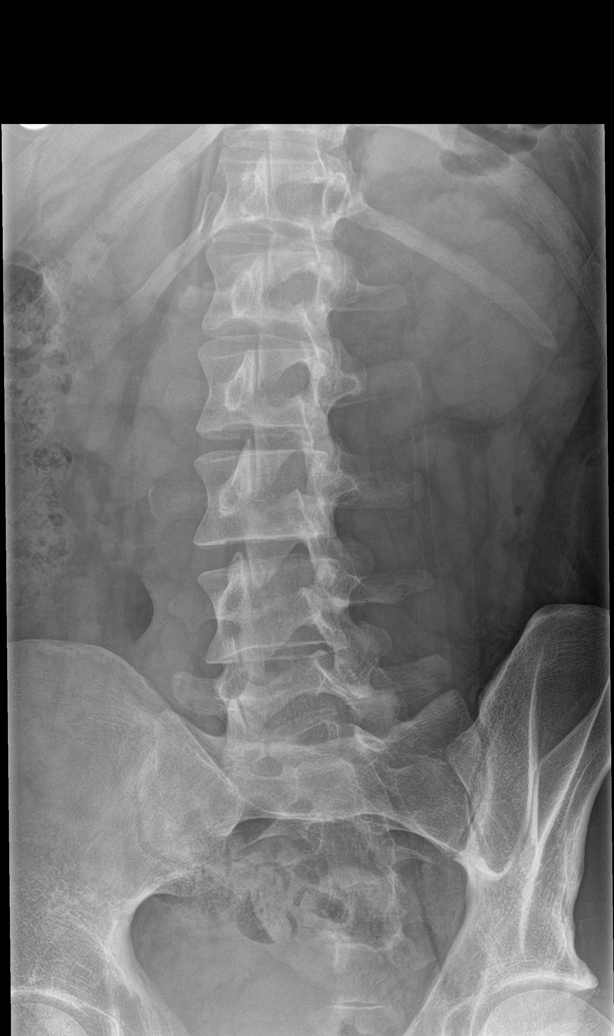

[l-spine obl (2 of 2)]
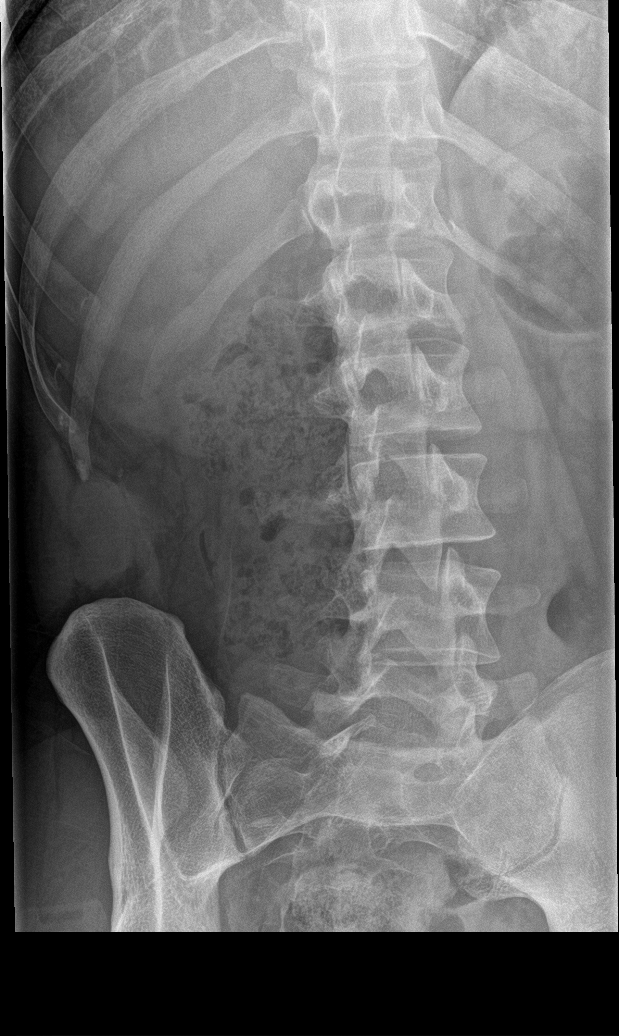

[l-spine lat]
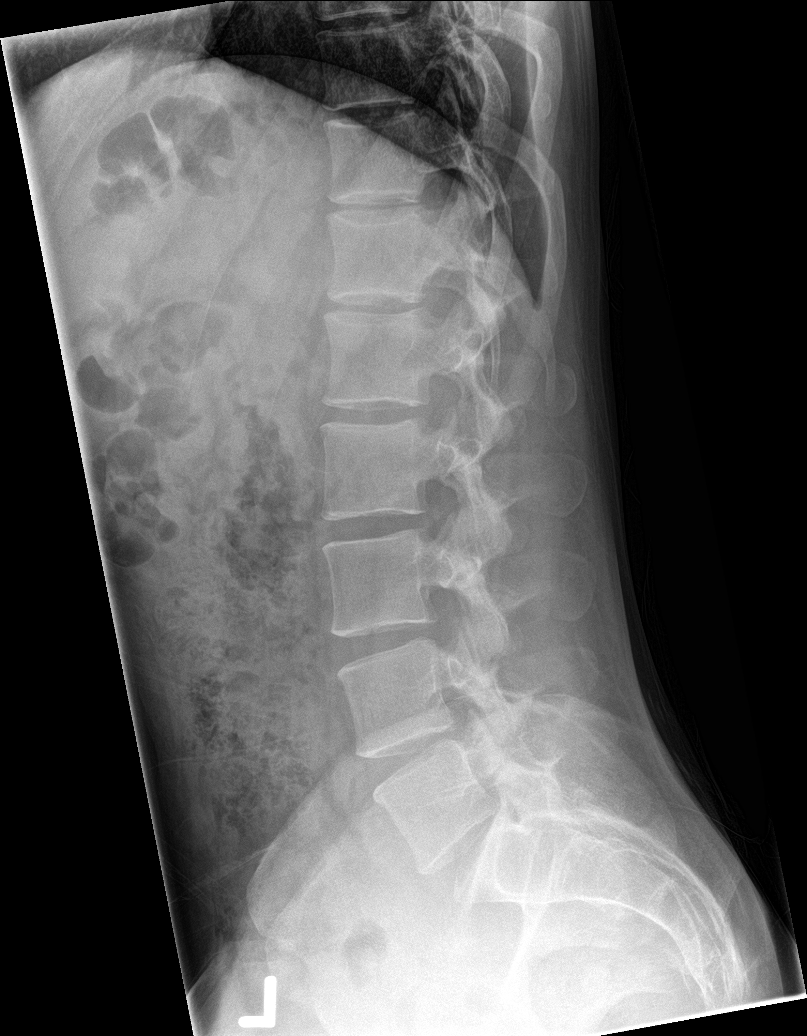

[l-spine spot]
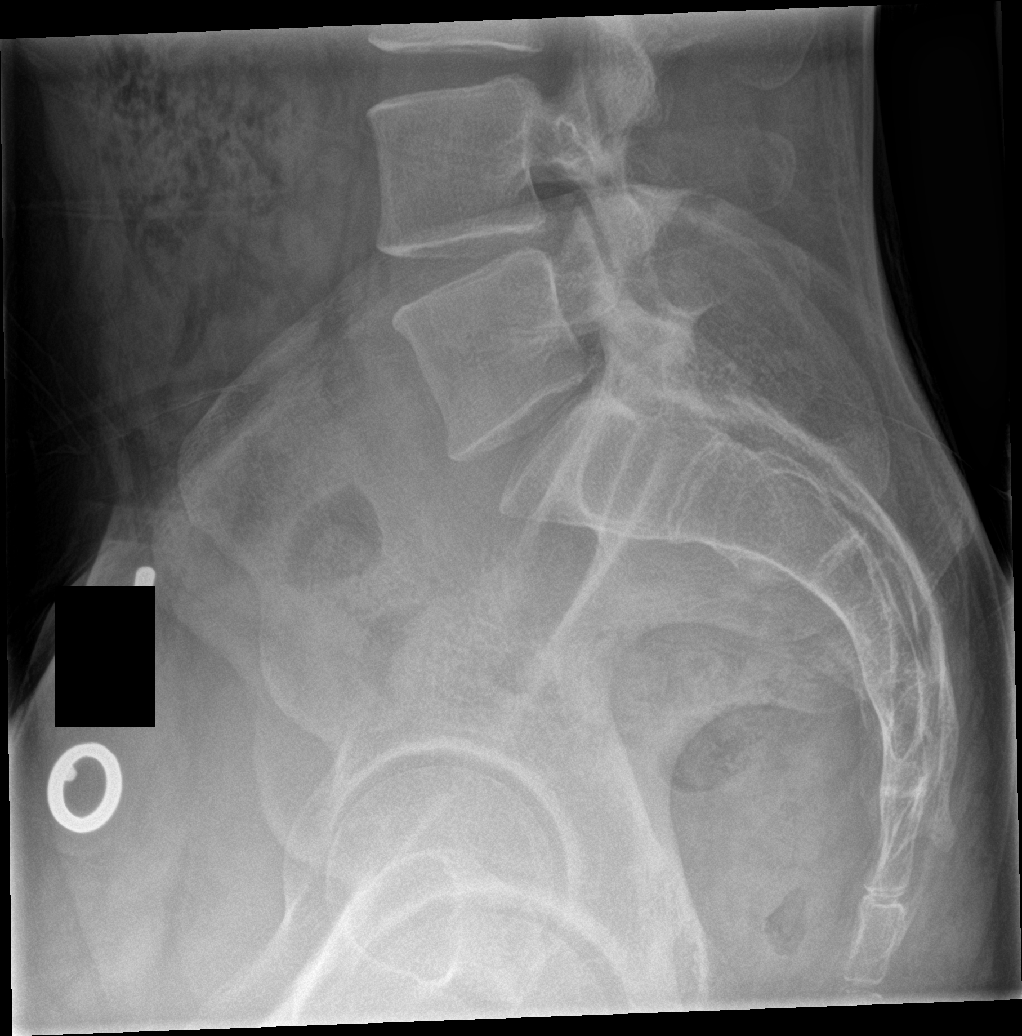

[5 of 5 positions shown; findings below may reference images not displayed]

FINDINGS: Normal alignment. Negative for fracture or pars defect. Mild disc
space narrowing at L3-4 and L4-5 without significant spurring.
Remaining disc spaces intact. SI joints normal. Facet joints intact.
IMPRESSION: Mild disc space narrowing L3-4 and L4-5.  Otherwise normal.

## 2015-02-16 MED ORDER — CYCLOBENZAPRINE HCL 10 MG PO TABS: ORAL_TABLET | ORAL | Status: DC

## 2015-02-16 MED ORDER — MELOXICAM 15 MG PO TABS: ORAL_TABLET | ORAL | Status: DC

## 2015-02-16 NOTE — Assessment & Plan Note (Signed)
With occasional right-sided radiating pain but does not correspond to a true radiculopathy, review a CT scan from earlier this year, there is both L4-L5 and L5-S1 disc protrusions without any evidence of facet arthritis. We are going to start conservatively with formal physical therapy, meloxicam, Flexeril at bedtime, baseline x-rays.  Return in one month, MRI for interventional planning if no better.

## 2015-02-16 NOTE — Progress Notes (Signed)
   Subjective:    I'm seeing this patient as a consultation for:  Dr. Denton Ar  CC: Low back pain  HPI: This is a pleasant 21 neuro male with a history of testicular cancer post orchiectomy with negative PET scans twice a year, who comes in with a several year history of intermittent back pain, moderate, persistent, localized bilaterally in the low back with occasional radiation to the posterior right thigh, moderate, persistent, no bowel or bladder discussion, saddle numbness, no constitutional symptoms, nothing overtly radicular, pains discogenic and worse with flexion.  Past medical history, Surgical history, Family history not pertinant except as noted below, Social history, Allergies, and medications have been entered into the medical record, reviewed, and no changes needed.   Review of Systems: No headache, visual changes, nausea, vomiting, diarrhea, constipation, dizziness, abdominal pain, skin rash, fevers, chills, night sweats, weight loss, swollen lymph nodes, body aches, joint swelling, muscle aches, chest pain, shortness of breath, mood changes, visual or auditory hallucinations.   Objective:   General: Well Developed, well nourished, and in no acute distress.  Neuro/Psych: Alert and oriented x3, extra-ocular muscles intact, able to move all 4 extremities, sensation grossly intact. Skin: Warm and dry, no rashes noted.  Respiratory: Not using accessory muscles, speaking in full sentences, trachea midline.  Cardiovascular: Pulses palpable, no extremity edema. Abdomen: Does not appear distended. Back Exam:  Inspection: Unremarkable  Motion: Flexion 45 deg, Extension 45 deg, Side Bending to 45 deg bilaterally,  Rotation to 45 deg bilaterally  SLR laying: Negative  XSLR laying: Negative  Palpable tenderness: None. FABER: negative. Sensory change: Gross sensation intact to all lumbar and sacral dermatomes.  Reflexes: 2+ at both patellar tendons, 2+ at achilles tendons, Babinski's  downgoing.  Strength at foot  Plantar-flexion: 5/5 Dorsi-flexion: 5/5 Eversion: 5/5 Inversion: 5/5  Leg strength  Quad: 5/5 Hamstring: 5/5 Hip flexor: 5/5 Hip abductors: 5/5  Gait unremarkable.  Recent PET/CT reviewed, there does appear to be an L4-5 and L5-S1 disc protrusion, facet joints look good.  Impression and Recommendations:   This case required medical decision making of moderate complexity.

## 2015-02-22 ENCOUNTER — Ambulatory Visit: Payer: BLUE CROSS/BLUE SHIELD | Admitting: Rehabilitative and Restorative Service Providers"

## 2015-03-01 ENCOUNTER — Ambulatory Visit (INDEPENDENT_AMBULATORY_CARE_PROVIDER_SITE_OTHER): Payer: BLUE CROSS/BLUE SHIELD | Admitting: Rehabilitative and Restorative Service Providers"

## 2015-03-01 ENCOUNTER — Encounter: Payer: Self-pay | Admitting: Rehabilitative and Restorative Service Providers"

## 2015-03-01 DIAGNOSIS — M623 Immobility syndrome (paraplegic): Secondary | ICD-10-CM | POA: Diagnosis not present

## 2015-03-01 DIAGNOSIS — M5136 Other intervertebral disc degeneration, lumbar region: Secondary | ICD-10-CM

## 2015-03-01 DIAGNOSIS — Z7409 Other reduced mobility: Secondary | ICD-10-CM

## 2015-03-01 DIAGNOSIS — M2560 Stiffness of unspecified joint, not elsewhere classified: Secondary | ICD-10-CM

## 2015-03-01 NOTE — Patient Instructions (Signed)
Abdominal Bracing With Pelvic Floor (Hook-Lying)   With neutral spine, tighten pelvic floor and abdominals and back muscles at waist. Hold 10 sec  Repeat _10__ times. Do _several__ times a day.     Trunk: Prone Extension (Press-Ups)   Lie on stomach on firm, flat surface. Relax bottom and legs. Raise chest in air with elbows straight. Keep hips flat on surface, sag stomach. Hold _2-3___ seconds. Repeat __10__ times. Do _2-3___ sessions per day. CAUTION: Movement should be gentle and slow.    Trunk Extension   Standing, place back of open hands on low back. Straighten spine then arch the back and move shoulders back. Repeat __2-5__ times per session. Pause 2-3 sec  Do __several__ times/day Variation: Seated  Copyright  [employer]. All rights reserved.    Hamstring Step 1   Straighten left knee. Keep right knee. Lith left leg. Hold __30-45_ seconds. Relax knee by returning foot to start. Repeat _3__ times. 3-4 times/day    Stretch out strap  [email or web address]

## 2015-03-01 NOTE — Therapy (Signed)
[place], [place], [address] Phone: [phone]   Fax:  [phone]  Physical Therapy Evaluation  Patient Details  Name: Christopher Swanson MRN: [id number] Date of Birth: Jul 05, 1986 Referring Provider:  Silverio Decamp,*  Encounter Date: 03/01/2015      PT End of Session - 03/01/15 1411    Visit Number 1   Number of Visits 8   Date for PT Re-Evaluation 04/26/15   PT Start Time 1324   PT Stop Time 1511   PT Time Calculation (min) 60 min   Activity Tolerance Patient tolerated treatment well      Past Medical History  Diagnosis Date  . ADD (attention deficit disorder)   . Testicular tumor     left    Past Surgical History  Procedure Laterality Date  . Circumcision  AGE 29  . Wisdom tooth extraction  AGE 29  . Orchiectomy Left 12/16/2012    Procedure: ORCHIECTOMY;  Surgeon: Bernestine Amass, MD;  Location: [facility];  Service: Urology;  Laterality: Left;  . Testicular prosthetic insertion Left 12/16/2012    Procedure: INSERTION TESTICULAR PROSTHESIS;  Surgeon: Bernestine Amass, MD;  Location[facility];  Service: Urology;  Laterality: Left;    There were no vitals filed for this visit.  Visit Diagnosis:  DDD (degenerative disc disease), lumbar - Plan: PT plan of care cert/re-cert  Stiffness due to immobility - Plan: PT plan of care cert/re-cert  Impaired mobility and endurance - Plan: PT plan of care cert/re-cert      Subjective Assessment - 03/01/15 1414    Subjective Patient reports injury to LB ~ 1 month ago.helping his brother move. Had some discomfort but not pain. Iced back and keep working out. Two days later bent forward to get something out of frig and felt a pop. Pain has continued , now more  soreness and stiffness - Rt > Lt. On meds and icing at night. Improving.  Tight hamstrings Rt > Lt   Pertinent History Injury to low back ~ 2 years ago played  golf twice over the weekend - bent forward, felt a pop and experienced pain in the LB. Treated with cortisone injection and rest resolving in ~ 1 month to sit and change positions comfortably - 3 months to return to normal functional activities. Torn Rt hamstring  ~ 10 years ago playing HS soccer; History of testicular cancer - not active - followed medically    How long can you sit comfortably? 1 hour starts to experience stiffness   How long can you stand comfortably? 2-4 hours   How long can you walk comfortably? 1-2 hours or more    -  ran 5 miles on Wednesday   Diagnostic tests xrays DDD L4/L5   Patient Stated Goals Get discs closer back into place and get exercises to do daily to prevent future problems   Currently in Pain? Yes   Pain Score 0-No pain   Pain Location Back  6 rating stiffness   Pain Orientation Lower;Right   Pain Descriptors / Indicators Tightness  stiffness   Pain Type Acute pain   Pain Onset More than a month ago   Pain Frequency Rarely  only feels stiffness now   Aggravating Factors  sitting for prolonged periods of time; golf swing; riding distances in car   Pain Relieving Factors meds/ice/walking/stretching            [facility] PT Assessment -  03/01/15 0001    Assessment   Medical Diagnosis Lumbar DDD   Onset Date/Surgical Date 01/23/15   Hand Dominance Right   Next MD Visit 09/16   Precautions   Precaution Comments no modalities other than heat/cold due to history of CA   Restrictions   Other Position/Activity Restrictions no heavy lifting/stand and walk frequently in the day   Balance Screen   Has the patient fallen in the past 6 months No   Has the patient had a decrease in activity level because of a fear of falling?  No   Is the patient reluctant to leave their home because of a fear of falling?  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   Observation/Other Assessments   Focus on Therapeutic Outcomes (FOTO)  45% limitaion    Sensation   Additional Comments WNL's   Posture/Postural Control   Posture Comments head forward; shoulders rounded and elevated; head of the humerus anterior in orientation   AROM   Right/Left Hip --  WNL's bilat tight HS/IT band/piriformis bilat   Lumbar Flexion 80%   Lumbar Extension 65%   Lumbar - Right Side Bend 80%   Lumbar - Left Side Bend 80%   Lumbar - Right Rotation 75%   Lumbar - Left Rotation 75%   Strength   Right/Left Hip --  5/5 throughout bilat LE's   Flexibility   Hamstrings Lt 72 deg;; Rt 72 deg   Quadriceps WNL's bilat   Palpation   Spinal mobility tender lateral mobs L4/5; L4/5   Palpation comment tightness through Rt QL and lower lumbar paraspinals    Special Tests    Special Tests --  Faber/SLR WNL's bilat                   [facility] Adult PT Treatment/Exercise - 03/01/15 0001    Lumbar Exercises: Stretches   Passive Hamstring Stretch 3 reps;30 seconds   Passive Hamstring Stretch Limitations supine oppostie knee bent with strap   Standing Extension 5 reps  2-3 sec   Press Ups Limitations 10 reps 2-3 sec pause   Lumbar Exercises: Supine   AB Set Limitations 3 part core 10 reps   Cryotherapy   Number Minutes Cryotherapy 15 Minutes   Cryotherapy Location Lumbar Spine   Type of Cryotherapy Ice pack                PT Education - 03/01/15 1515    Education provided Yes   Education Details spine care/pathology; extension program; HEP; core stabilization   Person(s) Educated Patient   Methods Explanation;Demonstration;Tactile cues;Verbal cues;Handout   Comprehension Verbalized understanding;Returned demonstration;Verbal cues required;Tactile cues required             PT Long Term Goals - 03/01/15 1541    PT  LONG TERM GOAL #1   Title Patient I in HEP for discharge 04/26/15   Time 8   Period Weeks   Status New   PT LONG TERM GOAL #2   Title Increase hamstring flexibility to 80-85 deg bilat 04/26/15   Time 8   Period Weeks   Status New   PT LONG TERM GOAL #3   Title Improve trunk mobility to 90% of normal range 04/26/15   Time  8   Period Weeks   Status New   PT LONG TERM GOAL #4   Title Patient verbalizes return to all normal functional activities including running and gym program 04/26/15   Time 8   Period Weeks   Status New   PT LONG TERM GOAL #5   Title Decrease FOTO to </= 26% limitation 04/26/15   Time 8   Period Weeks   Status New               Plan - 03/01/15 1522    Clinical Impression Statement Darrell presents with decreased lumbar mobility; tightness through hamstrings, piriformis and IT band bilateally; tenderness and tightness through Rt QL and lumbar paraspinals L4/5; stiffness on a daily basis and intermittent pain in Rt lumbar spine area. Treatment will focus on core stabilization and stretching as well as education re ergonomic modifications for work, home and gym. Patient will benefit from PT to decrease symptoms and progress to a healthy spine care program.    Pt will benefit from skilled therapeutic intervention in order to improve on the following deficits Pain;Decreased range of motion;Decreased mobility;Decreased activity tolerance;Decreased endurance   Rehab Potential Good   PT Frequency 1x / week   PT Duration 8 weeks   PT Treatment/Interventions Patient/family education;ADLs/Self Care Home Management;Therapeutic exercise;Therapeutic activities;Manual lymph drainage;Dry needling;Neuromuscular re-education;Cryotherapy;Electrical Stimulation;Ultrasound;Moist Heat   PT Next Visit Plan IT band stretch; piriformis stretch; pec stretching; posterior shoulder girdle strengthening; education re gym program; add stretch for QL at some point(when symptoms are resolved)    PT Home Exercise Plan 3 part core; stretching; extension program; myofacial ball release work   [employer] and Agree with Plan of Care Patient         Problem List Patient Active Problem List   Diagnosis Date Noted  . Lumbar degenerative disc disease 02/16/2015  . Cancer of testis, seminoma postorchiectomy 12/16/2012  . ADD (attention deficit disorder) 07/31/2012    Nyheem Binette Nilda Simmer, PT, MPH 03/01/2015, 3:55 PM  [facility] [place], [place], [address] Phone: [phone]   Fax:  [phone]

## 2015-03-08 ENCOUNTER — Encounter: Payer: BLUE CROSS/BLUE SHIELD | Admitting: Physical Therapy

## 2015-03-12 ENCOUNTER — Encounter: Payer: Self-pay | Admitting: Rehabilitative and Restorative Service Providers"

## 2015-03-12 ENCOUNTER — Ambulatory Visit (INDEPENDENT_AMBULATORY_CARE_PROVIDER_SITE_OTHER): Payer: BLUE CROSS/BLUE SHIELD | Admitting: Rehabilitative and Restorative Service Providers"

## 2015-03-12 DIAGNOSIS — M623 Immobility syndrome (paraplegic): Secondary | ICD-10-CM | POA: Diagnosis not present

## 2015-03-12 DIAGNOSIS — M5136 Other intervertebral disc degeneration, lumbar region: Secondary | ICD-10-CM | POA: Diagnosis not present

## 2015-03-12 DIAGNOSIS — Z7409 Other reduced mobility: Secondary | ICD-10-CM

## 2015-03-12 DIAGNOSIS — M2560 Stiffness of unspecified joint, not elsewhere classified: Secondary | ICD-10-CM

## 2015-03-12 NOTE — Therapy (Signed)
[place], [place], [address] Phone: [phone]   Fax:  [phone]  Physical Therapy Treatment  Patient Details  Name: Christopher Swanson MRN: [id number] Date of Birth: 07/16/1985 Referring Provider:  Silverio Decamp,*  Encounter Date: 03/12/2015      PT End of Session - 03/12/15 1210    Visit Number 2   Number of Visits 8   Date for PT Re-Evaluation 04/26/15   PT Start Time 1107   PT Stop Time 1210   PT Time Calculation (min) 63 min      Past Medical History  Diagnosis Date  . ADD (attention deficit disorder)   . Testicular tumor     left    Past Surgical History  Procedure Laterality Date  . Circumcision  AGE 29  . Wisdom tooth extraction  AGE 29  . Orchiectomy Left 12/16/2012    Procedure: ORCHIECTOMY;  Surgeon: Bernestine Amass, MD;  Location: [facility];  Service: Urology;  Laterality: Left;  . Testicular prosthetic insertion Left 12/16/2012    Procedure: INSERTION TESTICULAR PROSTHESIS;  Surgeon: Bernestine Amass, MD;  Location: [facility];  Service: Urology;  Laterality: Left;    There were no vitals filed for this visit.  Visit Diagnosis:  DDD (degenerative disc disease), lumbar  Stiffness due to immobility  Impaired mobility and endurance      Subjective Assessment - 03/12/15 1110    Subjective Back has improved. doing exercises; running 2-4 miles/3-4 times/wk   Currently in Pain? No/denies   Pain Orientation Lower;Right   Pain Descriptors / Indicators Tightness   Pain Type Acute pain   Pain Onset More than a month ago   Pain Frequency Rarely            [facility] PT Assessment - 03/12/15 0001    Observation/Other Assessments   Focus on Therapeutic Outcomes (FOTO)  26% limitation   AROM   Right/Left Hip --  WFL's throughout   Flexibility   Hamstrings Lt 85 deg; Rt 82 deg   Palpation   Palpation comment tightness through Rt QL and  lower lumbar paraspinals                      [facility] Adult PT Treatment/Exercise - 03/12/15 0001    Lumbar Exercises: Stretches   Passive Hamstring Stretch 3 reps;30 seconds  variations of HS stretch with abduction and adduction of hip   Passive Hamstring Stretch Limitations supine oppostie knee bent with strap   Standing Extension 5 reps  2-3 sec   Press Ups Limitations 10 reps 2-3 sec pause   ITB Stretch 30 seconds;3 reps   Piriformis Stretch 3 reps;30 seconds   Piriformis Stretch Limitations QL stretch sitting trunk flexion to Rt 1 min hold   Lumbar Exercises: Standing   Other Standing Lumbar Exercises doorway stretches 3 positioins 3 reps 30 sec hold   Other Standing Lumbar Exercises scap squeeze with foam roll 10 reps 10 sec hold; posterior shoulder girdle strengthening 3 TB exercises with scap squeeze 10 each red TB   Lumbar Exercises: Supine   AB Set Limitations 3 part core 10 reps   Cryotherapy   Number Minutes Cryotherapy 15 Minutes   Cryotherapy Location Lumbar Spine   Type of Cryotherapy Ice pack                PT Education - 03/12/15 1205    Education provided Yes  Education Details spine health; HEP; return to gym instructions   Person(s) Educated Patient   Methods Explanation;Demonstration;Tactile cues;Verbal cues;Handout   Comprehension Verbalized understanding;Returned demonstration;Verbal cues required;Tactile cues required             PT Long Term Goals - 03/12/15 1212    PT LONG TERM GOAL #1   Title Patient I in HEP for discharge 04/26/15   Time 8   Period Weeks   Status Achieved   PT LONG TERM GOAL #2   Title Increase hamstring flexibility to 80-85 deg bilat 04/26/15   Time 8   Period Weeks   Status Achieved   PT LONG TERM GOAL #3   Title Improve trunk mobility to 90% of normal range 04/26/15   Time 8   Period Weeks   Status Achieved   PT LONG TERM GOAL #4   Title Patient verbalizes return to all normal functional  activities including running and gym program 04/26/15   Time 8   Period Weeks   Status Achieved   PT LONG TERM GOAL #5   Title Decrease FOTO to </= 26% limitation 04/26/15   Time 8   Period Weeks   Status Achieved               Plan - 03/12/15 1210    Clinical Impression Statement Patient reports decreased symptoms; improved mobility; return to mor normal functional activity level. He understands exercises and feels confident in continuing HEP independently.    Pt will benefit from skilled therapeutic intervention in order to improve on the following deficits Pain;Decreased range of motion;Decreased mobility;Decreased activity tolerance;Decreased endurance   Rehab Potential Good   PT Frequency 1x / week   PT Duration 8 weeks   PT Treatment/Interventions Patient/family education;ADLs/Self Care Home Management;Therapeutic exercise;Therapeutic activities;Manual lymph drainage;Dry needling;Neuromuscular re-education;Cryotherapy;Electrical Stimulation;Ultrasound;Moist Heat   PT Next Visit Plan D/C to I HEP   PT Home Exercise Plan core stabilization; stretching; postural correction; HEP   Consulted and Agree with Plan of Care Patient        Problem List Patient Active Problem List   Diagnosis Date Noted  . Lumbar degenerative disc disease 02/16/2015  . Cancer of testis, seminoma postorchiectomy 12/16/2012  . ADD (attention deficit disorder) 07/31/2012    Aleea Hendry Nilda Simmer PT, MPH 03/12/2015, 12:20 PM  [facility] [place], [place], [address] Phone: [phone]   Fax:  [phone]  PHYSICAL THERAPY DISCHARGE SUMMARY  Visits from Start of Care: 2  Current functional level related to goals / functional outcomes: Goals accomplished. Returned to normal functional activity   Remaining deficits: Tightness QL   Education / Equipment: HEP/theraband  Plan: Patient agrees to discharge.  Patient goals were  met. Patient is being discharged due to meeting the stated rehab goals.  ?????    Petra Sargeant P. Helene Kelp PT, MPH 03/12/2015 12:23 PM

## 2015-03-12 NOTE — Patient Instructions (Signed)
Axial Extension (Chin Tuck)   Pull chin in and lengthen back of neck. Hold _10___ seconds while counting out loud. Repeat __10__ times. Do _several___ sessions per day.    Shoulder Blade Squeeze   Rotate shoulders back, then squeeze shoulder blades down and back. Hold 10 sec. Repeat _10___ times. Do __several__ sessions per day. Can use swim noodle behind back.      IT band stretch  Same as hamstring stretch - pull leg across body  30 sec 3 reps   Piriformis Stretch   Lying on back, pull right knee toward opposite shoulder. Hold _20-30___ seconds. Repeat __3__ times. Do __2__ sessions per day.   Scapula Adduction With Pectorals, Low   Stand in doorframe with palms against frame and arms at 45. Lean forward and squeeze shoulder blades. Hold _30__ seconds. Repeat _3__ times per session. Do _3-4__ sessions per day.    Scapula Adduction With Pectorals, Mid-Range   Stand in doorframe with palms against frame and arms at 90. Lean forward and squeeze shoulder blades. Hold _30__ seconds. Repeat _3__ times per session. Do _2-3__ sessions per day.   Scapula Adduction With Pectorals, High   Stand in doorframe with palms against frame and arms at 120. Lean forward and squeeze shoulder blades. Hold __30_ seconds. Repeat _3__ times per session. Do _2-3__ sessions per day.   Resisted External Rotation: in Neutral - Bilateral   PALMS UP - forearms parallel to floor Sit or stand, tubing in both hands, elbows at sides, bent to 90, forearms forward. Pinch shoulder blades together and rotate forearms out. Keep elbows at sides. Repeat __10__ times per set. Do _2-3___ sets per session. Do _2-3___ sessions per day.   Low Row: Standing   Face anchor, feet shoulder width apart. Palms up, pull arms back, squeezing shoulder blades together. Repeat 10__ times per set. Do 2-3__ sets per session. Do 2-3__ sessions per week. Anchor Height: Waist     Strengthening: Resisted  Extension   Hold tubing in right hand, arm forward. Pull arm back, elbow straight. Repeat _10___ times per set. Do 2-3____ sets per session. Do 2-3____ sessions per day.  Gym  Avoid overhead press; bench press; pec fly  Engage core with all gym exercises!

## 2015-03-18 ENCOUNTER — Encounter: Payer: Self-pay | Admitting: Sports Medicine

## 2015-03-18 ENCOUNTER — Ambulatory Visit (INDEPENDENT_AMBULATORY_CARE_PROVIDER_SITE_OTHER): Payer: BLUE CROSS/BLUE SHIELD | Admitting: Sports Medicine

## 2015-03-18 VITALS — BP 124/79 | HR 50 | Ht 72.0 in | Wt 182.0 lb

## 2015-03-18 DIAGNOSIS — M51369 Other intervertebral disc degeneration, lumbar region without mention of lumbar back pain or lower extremity pain: Secondary | ICD-10-CM

## 2015-03-18 DIAGNOSIS — M5136 Other intervertebral disc degeneration, lumbar region: Secondary | ICD-10-CM | POA: Diagnosis not present

## 2015-03-18 NOTE — Progress Notes (Signed)
  Subjective:    CC:  Follow-up  HPI: Logen returns, he has discogenic type back pain, with meloxicam, and formal physical therapy has done extremely well and is essentially pain-free, and happy with how things are going.  Past medical history, Surgical history, Family history not pertinant except as noted below, Social history, Allergies, and medications have been entered into the medical record, reviewed, and no changes needed.   Review of Systems: No fevers, chills, night sweats, weight loss, chest pain, or shortness of breath.   Objective:    General: Well Developed, well nourished, and in no acute distress.  Neuro: Alert and oriented x3, extra-ocular muscles intact, sensation grossly intact.  HEENT: Normocephalic, atraumatic, pupils equal round reactive to light, neck supple, no masses, no lymphadenopathy, thyroid nonpalpable.  Skin: Warm and dry, no rashes. Cardiac: Regular rate and rhythm, no murmurs rubs or gallops, no lower extremity edema.  Respiratory: Clear to auscultation bilaterally. Not using accessory muscles, speaking in full sentences. Back Exam:  Inspection: Unremarkable  Motion: Flexion 45 deg, Extension 45 deg, Side Bending to 45 deg bilaterally,  Rotation to 45 deg bilaterally  SLR laying: Negative  XSLR laying: Negative  Palpable tenderness: None. FABER: negative. Sensory change: Gross sensation intact to all lumbar and sacral dermatomes.  Reflexes: 2+ at both patellar tendons, 2+ at achilles tendons, Babinski's downgoing.  Strength at foot  Plantar-flexion: 5/5 Dorsi-flexion: 5/5 Eversion: 5/5 Inversion: 5/5  Leg strength  Quad: 5/5 Hamstring: 5/5 Hip flexor: 5/5 Hip abductors: 5/5  Gait unremarkable.  Impression and Recommendations:

## 2015-03-18 NOTE — Assessment & Plan Note (Signed)
Discogenic pain is all but resolved. He does extremity well with occasional meloxicam, I did review a PET CT scan from earlier this year, there were both L4-5 and L5-S1 disc protrusions without any facet arthritis. He can continue with conservative measures for now. Return to see me on an as-needed basis.

## 2015-07-29 ENCOUNTER — Ambulatory Visit (INDEPENDENT_AMBULATORY_CARE_PROVIDER_SITE_OTHER): Payer: BLUE CROSS/BLUE SHIELD

## 2015-07-29 ENCOUNTER — Encounter: Payer: Self-pay | Admitting: Sports Medicine

## 2015-07-29 ENCOUNTER — Ambulatory Visit (INDEPENDENT_AMBULATORY_CARE_PROVIDER_SITE_OTHER): Payer: BLUE CROSS/BLUE SHIELD | Admitting: Sports Medicine

## 2015-07-29 VITALS — BP 130/82 | HR 80 | Resp 18 | Wt 183.1 lb

## 2015-07-29 DIAGNOSIS — M222X2 Patellofemoral disorders, left knee: Secondary | ICD-10-CM | POA: Diagnosis not present

## 2015-07-29 DIAGNOSIS — M25562 Pain in left knee: Secondary | ICD-10-CM | POA: Diagnosis not present

## 2015-07-29 DIAGNOSIS — M25561 Pain in right knee: Secondary | ICD-10-CM | POA: Diagnosis not present

## 2015-07-29 IMAGING — CR DG KNEE 1-2V*R*
4 series · 4 of 4 positions shown · non-contrast
Comparison: None.

CLINICAL DATA: 29-year-old male with knee pain for 1 month. No
known injury.

EXAM:
RIGHT KNEE - 1-2 VIEW

[knee lat]
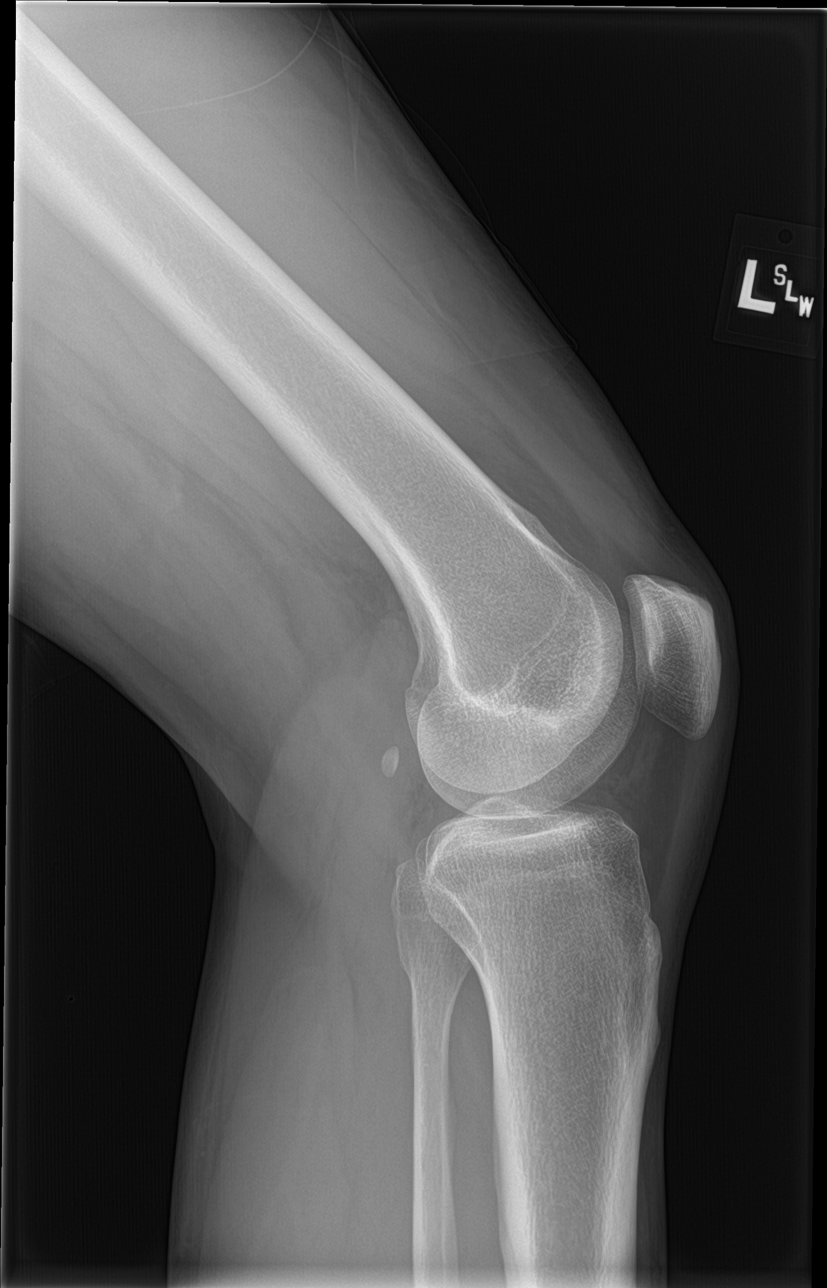

[knee sunrise]
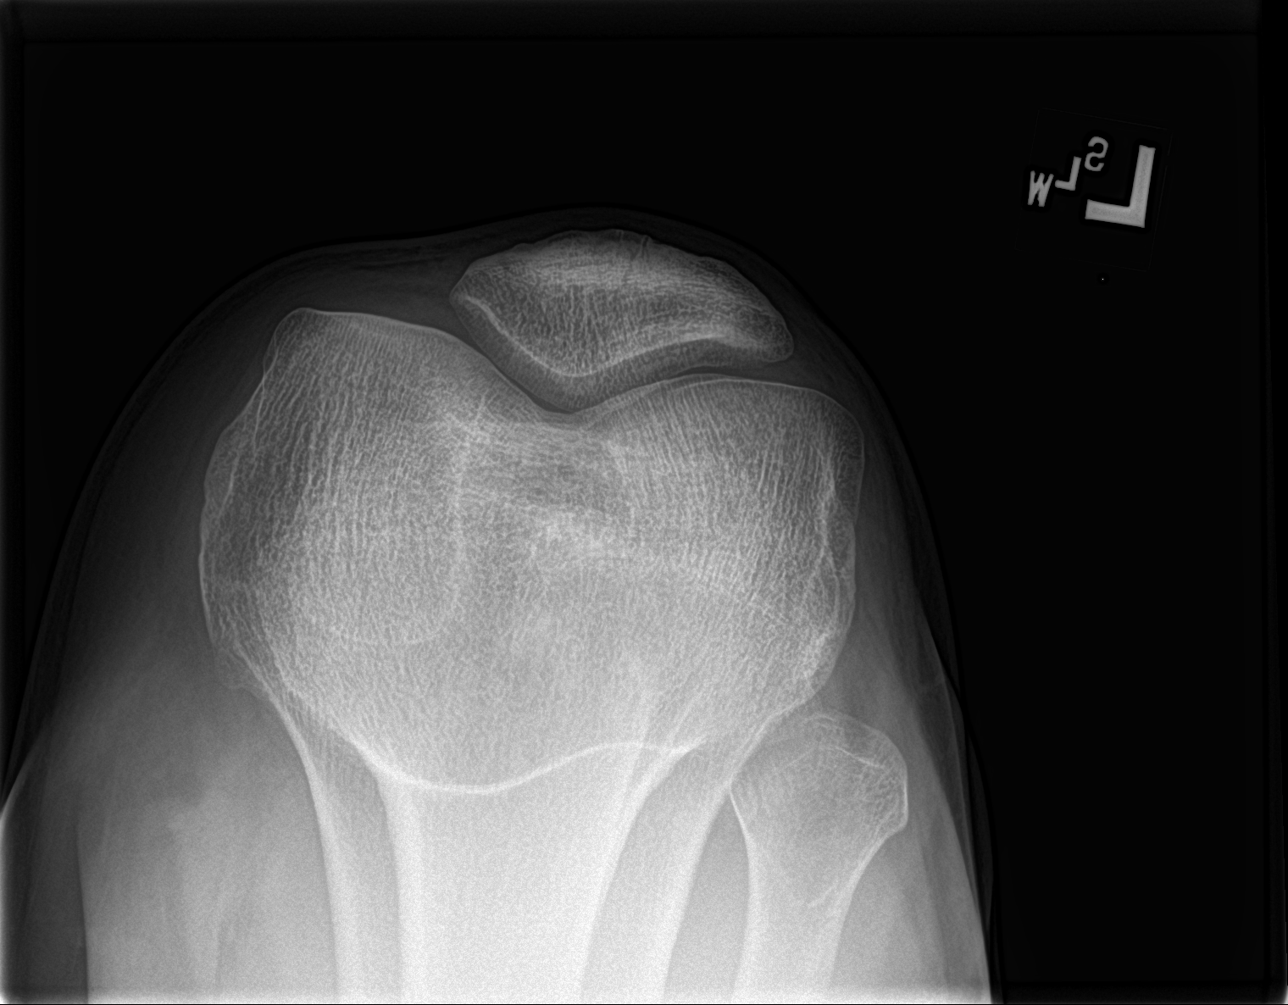

[knee ap bilat standing (1 of 2)]
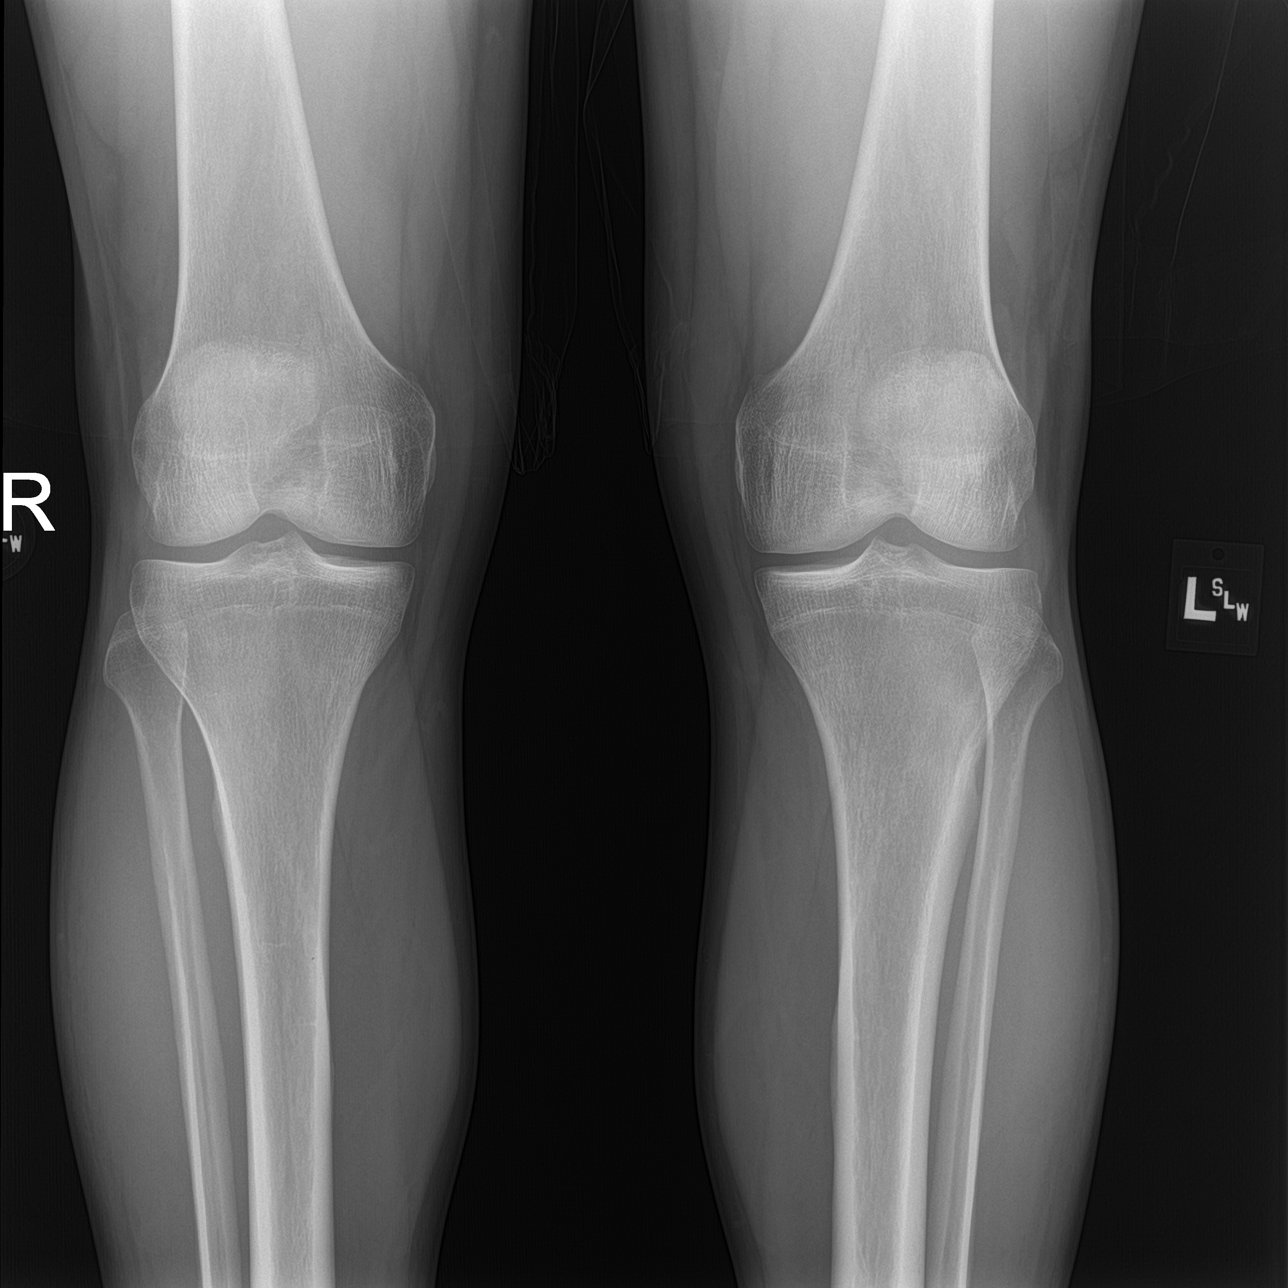

[knee ap bilat standing (2 of 2)]
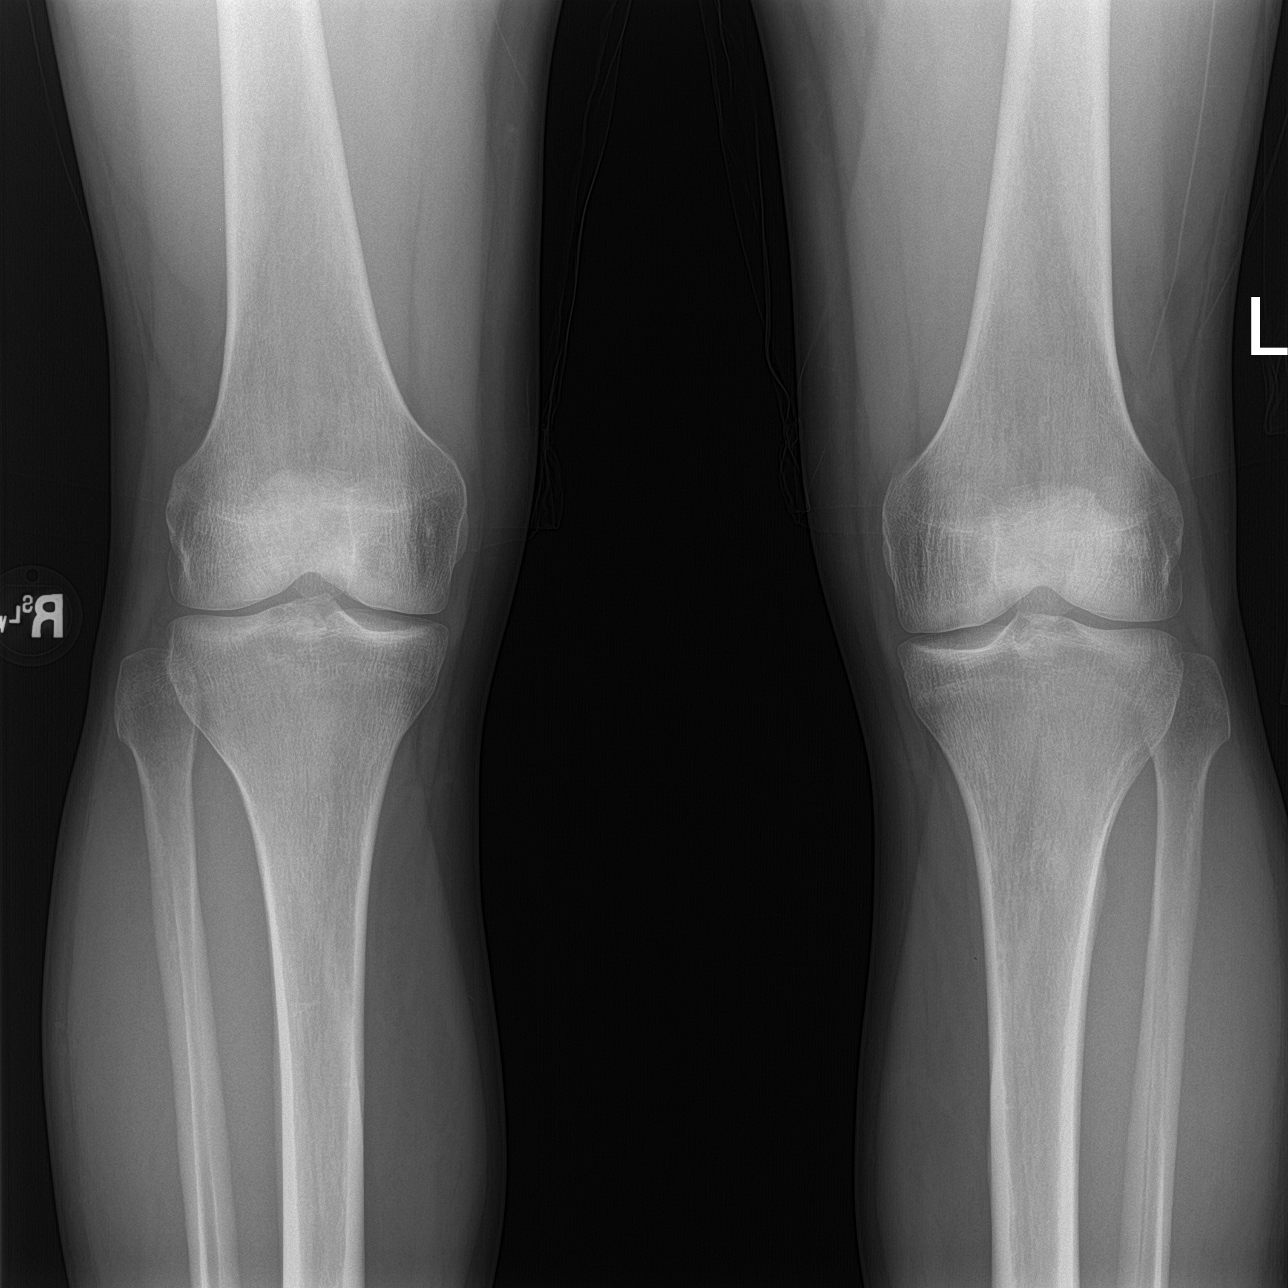

[4 of 4 positions shown; findings below may reference images not displayed]

FINDINGS: There is no evidence of fracture, dislocation, or joint effusion.
There is no evidence of arthropathy or other focal bone abnormality.
Soft tissues are unremarkable.
IMPRESSION: Negative.

## 2015-07-29 MED ORDER — MELOXICAM 15 MG PO TABS: ORAL_TABLET | ORAL | Status: DC

## 2015-07-29 NOTE — Assessment & Plan Note (Signed)
Home rehabilitation, hip abductor rehabilitation, meloxicam, x-rays.  Return in 6 weeks, formal physical therapy if no better.

## 2015-07-29 NOTE — Progress Notes (Signed)
   Subjective:    I'm seeing this patient as a consultation for: Dr. Wenda Low  CC: Left knee pain  HPI: Patient presents with a 1 month history of of left knee pain. He reports running on the treadmill when he began to feel knee pain. It was not associated with trauma or any precipitating event. At onset, there was no swelling or bruising. He began to bike instead of run, which decreased his pain. He has not taken anything for his pain and is no longer taking the Meloxicam for his back pain. He says that the pain is most notable when walking up stairs or with squatting. The pain does not radiate. The pain is "mild" and he says that he feels unsteady on his knee like it might buckle. He has not had any falls. He denies fevers, erythema, weakness, numbness, or tingling.  Past medical history, Surgical history, Family history not pertinant except as noted below, Social history, Allergies, and medications have been entered into the medical record, reviewed, and no changes needed.   Review of Systems: No headache, visual changes, nausea, vomiting, diarrhea, constipation, dizziness, abdominal pain, skin rash, fevers, chills, night sweats, weight loss, swollen lymph nodes, body aches, joint swelling, muscle aches, chest pain, shortness of breath, mood changes, visual or auditory hallucinations.   Objective:   General: Well Developed, well nourished, and in no acute distress.  Neuro/Psych: Alert and oriented x3, extra-ocular muscles intact, able to move all 4 extremities, sensation grossly intact. Skin: Warm and dry, no rashes noted.  Respiratory: Not using accessory muscles, speaking in full sentences, trachea midline.  Cardiovascular: Pulses palpable, no extremity edema. Abdomen: Does not appear distended. Left Knee: Normal to inspection with no erythema or effusion or obvious bony abnormalities. Palpation notable for patellar tenderness with no warmth, joint line tenderness, or condyle  tenderness. ROM full in flexion and extension and lower leg rotation. Ligaments with solid consistent endpoints including ACL, PCL, LCL, MCL. Negative Mcmurray's, Apley's, and Thessalonian tests. Non painful patellar compression. Patellar glide with crepitus. Patellar and quadriceps tendons unremarkable. Hamstring and quadriceps strength is normal. Left Hip abduction 4/5   Impression and Recommendations:   This case required medical decision making of moderate complexity.  Patient likely has patellofemoral syndrome of the left knee given location of pain and weakness of hip abduction. Will Rx Meloxicam and give home exercises for rehab. Encouraged patient to schedule a return visit for orthotics. Will also get X-rays of the knee today to r/o occult processes.

## 2015-07-29 NOTE — Patient Instructions (Signed)
Hip Rehabilitation Protocol:  1.  Side leg raises.  3x30 with no weight, then 3x15 with 2 lb ankle weight, then 3x15 with 5 lb ankle weight 2.  Standing hip rotation.  3x30 with no weight, then 3x15 with 2 lb ankle weight, then 3x15 with 5 lb ankle weight. 3.  Side step ups.  3x30 with no weight, then 3x15 with 5 lbs in backpack, then 3x15 with 10 lbs in backpack. 

## 2015-08-05 ENCOUNTER — Encounter: Payer: BLUE CROSS/BLUE SHIELD | Admitting: Sports Medicine

## 2015-08-13 ENCOUNTER — Ambulatory Visit (INDEPENDENT_AMBULATORY_CARE_PROVIDER_SITE_OTHER): Payer: BLUE CROSS/BLUE SHIELD | Admitting: Sports Medicine

## 2015-08-13 ENCOUNTER — Encounter: Payer: Self-pay | Admitting: Sports Medicine

## 2015-08-13 VITALS — BP 115/71 | HR 68 | Resp 18 | Wt 183.0 lb

## 2015-08-13 DIAGNOSIS — M222X2 Patellofemoral disorders, left knee: Secondary | ICD-10-CM

## 2015-08-13 NOTE — Assessment & Plan Note (Signed)
Doing better with rehab exercises. Administrator as above.

## 2015-08-13 NOTE — Progress Notes (Signed)

## 2016-03-08 DIAGNOSIS — Z0000 Encounter for general adult medical examination without abnormal findings: Secondary | ICD-10-CM | POA: Diagnosis not present

## 2016-03-08 DIAGNOSIS — C6290 Malignant neoplasm of unspecified testis, unspecified whether descended or undescended: Secondary | ICD-10-CM | POA: Diagnosis not present

## 2016-03-08 DIAGNOSIS — Z1389 Encounter for screening for other disorder: Secondary | ICD-10-CM | POA: Diagnosis not present

## 2016-06-09 DIAGNOSIS — Z8547 Personal history of malignant neoplasm of testis: Secondary | ICD-10-CM | POA: Diagnosis not present

## 2016-06-29 DIAGNOSIS — Z8547 Personal history of malignant neoplasm of testis: Secondary | ICD-10-CM | POA: Diagnosis not present

## 2016-06-29 DIAGNOSIS — C4490 Unspecified malignant neoplasm of skin, unspecified: Secondary | ICD-10-CM | POA: Diagnosis not present

## 2016-08-02 DIAGNOSIS — D2261 Melanocytic nevi of right upper limb, including shoulder: Secondary | ICD-10-CM | POA: Diagnosis not present

## 2016-08-02 DIAGNOSIS — D2221 Melanocytic nevi of right ear and external auricular canal: Secondary | ICD-10-CM | POA: Diagnosis not present

## 2016-08-02 DIAGNOSIS — D225 Melanocytic nevi of trunk: Secondary | ICD-10-CM | POA: Diagnosis not present

## 2016-08-02 DIAGNOSIS — D485 Neoplasm of uncertain behavior of skin: Secondary | ICD-10-CM | POA: Diagnosis not present

## 2016-08-02 DIAGNOSIS — D224 Melanocytic nevi of scalp and neck: Secondary | ICD-10-CM | POA: Diagnosis not present

## 2016-08-02 DIAGNOSIS — Z808 Family history of malignant neoplasm of other organs or systems: Secondary | ICD-10-CM | POA: Diagnosis not present

## 2016-08-02 DIAGNOSIS — Z86018 Personal history of other benign neoplasm: Secondary | ICD-10-CM | POA: Diagnosis not present

## 2016-11-21 ENCOUNTER — Telehealth: Payer: Self-pay | Admitting: Emergency Medicine

## 2016-11-21 DIAGNOSIS — M5136 Other intervertebral disc degeneration, lumbar region: Secondary | ICD-10-CM

## 2016-11-21 DIAGNOSIS — M51369 Other intervertebral disc degeneration, lumbar region without mention of lumbar back pain or lower extremity pain: Secondary | ICD-10-CM

## 2016-11-21 MED ORDER — CYCLOBENZAPRINE HCL 10 MG PO TABS: ORAL_TABLET | ORAL | 0 refills | Status: DC

## 2016-11-21 MED ORDER — PREDNISONE 50 MG PO TABS: ORAL_TABLET | ORAL | 0 refills | Status: DC

## 2016-11-21 NOTE — Telephone Encounter (Signed)
Patient reports acute onset of back pain; has not had episode for about 2 years; wants to be seen by Dr.T and appt given for tomorrow at 0900. Discussed taking ibuprofen until visit.pk

## 2016-11-21 NOTE — Telephone Encounter (Signed)
Adding prednisone and Flexeril.

## 2016-11-22 ENCOUNTER — Ambulatory Visit: Payer: BLUE CROSS/BLUE SHIELD | Admitting: Sports Medicine

## 2016-11-24 ENCOUNTER — Ambulatory Visit (INDEPENDENT_AMBULATORY_CARE_PROVIDER_SITE_OTHER): Payer: BLUE CROSS/BLUE SHIELD | Admitting: Sports Medicine

## 2016-11-24 ENCOUNTER — Ambulatory Visit: Payer: Self-pay | Admitting: Sports Medicine

## 2016-11-24 ENCOUNTER — Encounter: Payer: Self-pay | Admitting: Sports Medicine

## 2016-11-24 DIAGNOSIS — M5136 Other intervertebral disc degeneration, lumbar region: Secondary | ICD-10-CM | POA: Diagnosis not present

## 2016-11-24 DIAGNOSIS — M51369 Other intervertebral disc degeneration, lumbar region without mention of lumbar back pain or lower extremity pain: Secondary | ICD-10-CM

## 2016-11-24 NOTE — Progress Notes (Signed)
  Subjective:    CC: Low back pain  HPI: Right is a very pleasant 31 year old male, he has known L4-5 and L5-S1 lumbar degenerative disc disease, for the past several days he's had worsening pain, moderate, persistent, localized without radiation, worse with sitting, flexion, Valsalva. No bowel or bladder dysfunction, saddle numbness, constitutional symptoms, nothing overtly radicular. I called him in some prednisone a couple of days ago when he returns today nearly pain-free.  Past medical history:  Negative.  See flowsheet/record as well for more information.  Surgical history: Negative.  See flowsheet/record as well for more information.  Family history: Negative.  See flowsheet/record as well for more information.  Social history: Negative.  See flowsheet/record as well for more information.  Allergies, and medications have been entered into the medical record, reviewed, and no changes needed.   Review of Systems: No fevers, chills, night sweats, weight loss, chest pain, or shortness of breath.   Objective:    General: Well Developed, well nourished, and in no acute distress.  Neuro: Alert and oriented x3, extra-ocular muscles intact, sensation grossly intact.  HEENT: Normocephalic, atraumatic, pupils equal round reactive to light, neck supple, no masses, no lymphadenopathy, thyroid nonpalpable.  Skin: Warm and dry, no rashes. Cardiac: Regular rate and rhythm, no murmurs rubs or gallops, no lower extremity edema.  Respiratory: Clear to auscultation bilaterally. Not using accessory muscles, speaking in full sentences. Back Exam:  Inspection: Unremarkable  Motion: Flexion 45 deg, Extension 45 deg, Side Bending to 45 deg bilaterally,  Rotation to 45 deg bilaterally  SLR laying: Negative  XSLR laying: Negative  Palpable tenderness: None. FABER: negative. Sensory change: Gross sensation intact to all lumbar and sacral dermatomes.  Reflexes: 2+ at both patellar tendons, 2+ at achilles  tendons, Babinski's downgoing.  Strength at foot  Plantar-flexion: 5/5 Dorsi-flexion: 5/5 Eversion: 5/5 Inversion: 5/5  Leg strength  Quad: 5/5 Hamstring: 5/5 Hip flexor: 5/5 Hip abductors: 5/5  Gait unremarkable.  Impression and Recommendations:    Lumbar degenerative disc disease Doing much better with prednisone and Flexeril, starting the rehabilitation exercises. He did have L4-5 and L5-S1 degenerative disc disease on an x-ray. Considering drastic improvement we will do no further intervention. Return as needed, he can call me for prednisone and Flexeril for flares.

## 2016-11-24 NOTE — Assessment & Plan Note (Signed)
Doing much better with prednisone and Flexeril, starting the rehabilitation exercises. He did have L4-5 and L5-S1 degenerative disc disease on an x-ray. Considering drastic improvement we will do no further intervention. Return as needed, he can call me for prednisone and Flexeril for flares.

## 2016-12-14 DIAGNOSIS — K2900 Acute gastritis without bleeding: Secondary | ICD-10-CM | POA: Diagnosis not present

## 2017-01-31 DIAGNOSIS — H1010 Acute atopic conjunctivitis, unspecified eye: Secondary | ICD-10-CM | POA: Diagnosis not present

## 2017-03-15 ENCOUNTER — Other Ambulatory Visit: Payer: Self-pay | Admitting: Internal Medicine

## 2017-03-15 ENCOUNTER — Ambulatory Visit
Admission: RE | Admit: 2017-03-15 | Discharge: 2017-03-15 | Disposition: A | Discharge status: Home / Self Care | Payer: BLUE CROSS/BLUE SHIELD | Source: Ambulatory Visit | Attending: Internal Medicine | Admitting: Internal Medicine

## 2017-03-15 DIAGNOSIS — C6290 Malignant neoplasm of unspecified testis, unspecified whether descended or undescended: Secondary | ICD-10-CM

## 2017-03-15 DIAGNOSIS — Z0000 Encounter for general adult medical examination without abnormal findings: Secondary | ICD-10-CM | POA: Diagnosis not present

## 2017-03-15 IMAGING — DX DG CHEST 2V
2 series · 2 of 2 positions shown · non-contrast
Comparison: Radiographs [DATE].

CLINICAL DATA: History of testicular cancer.

EXAM:
CHEST  2 VIEW

[dg chest 2 view (1 of 2)]
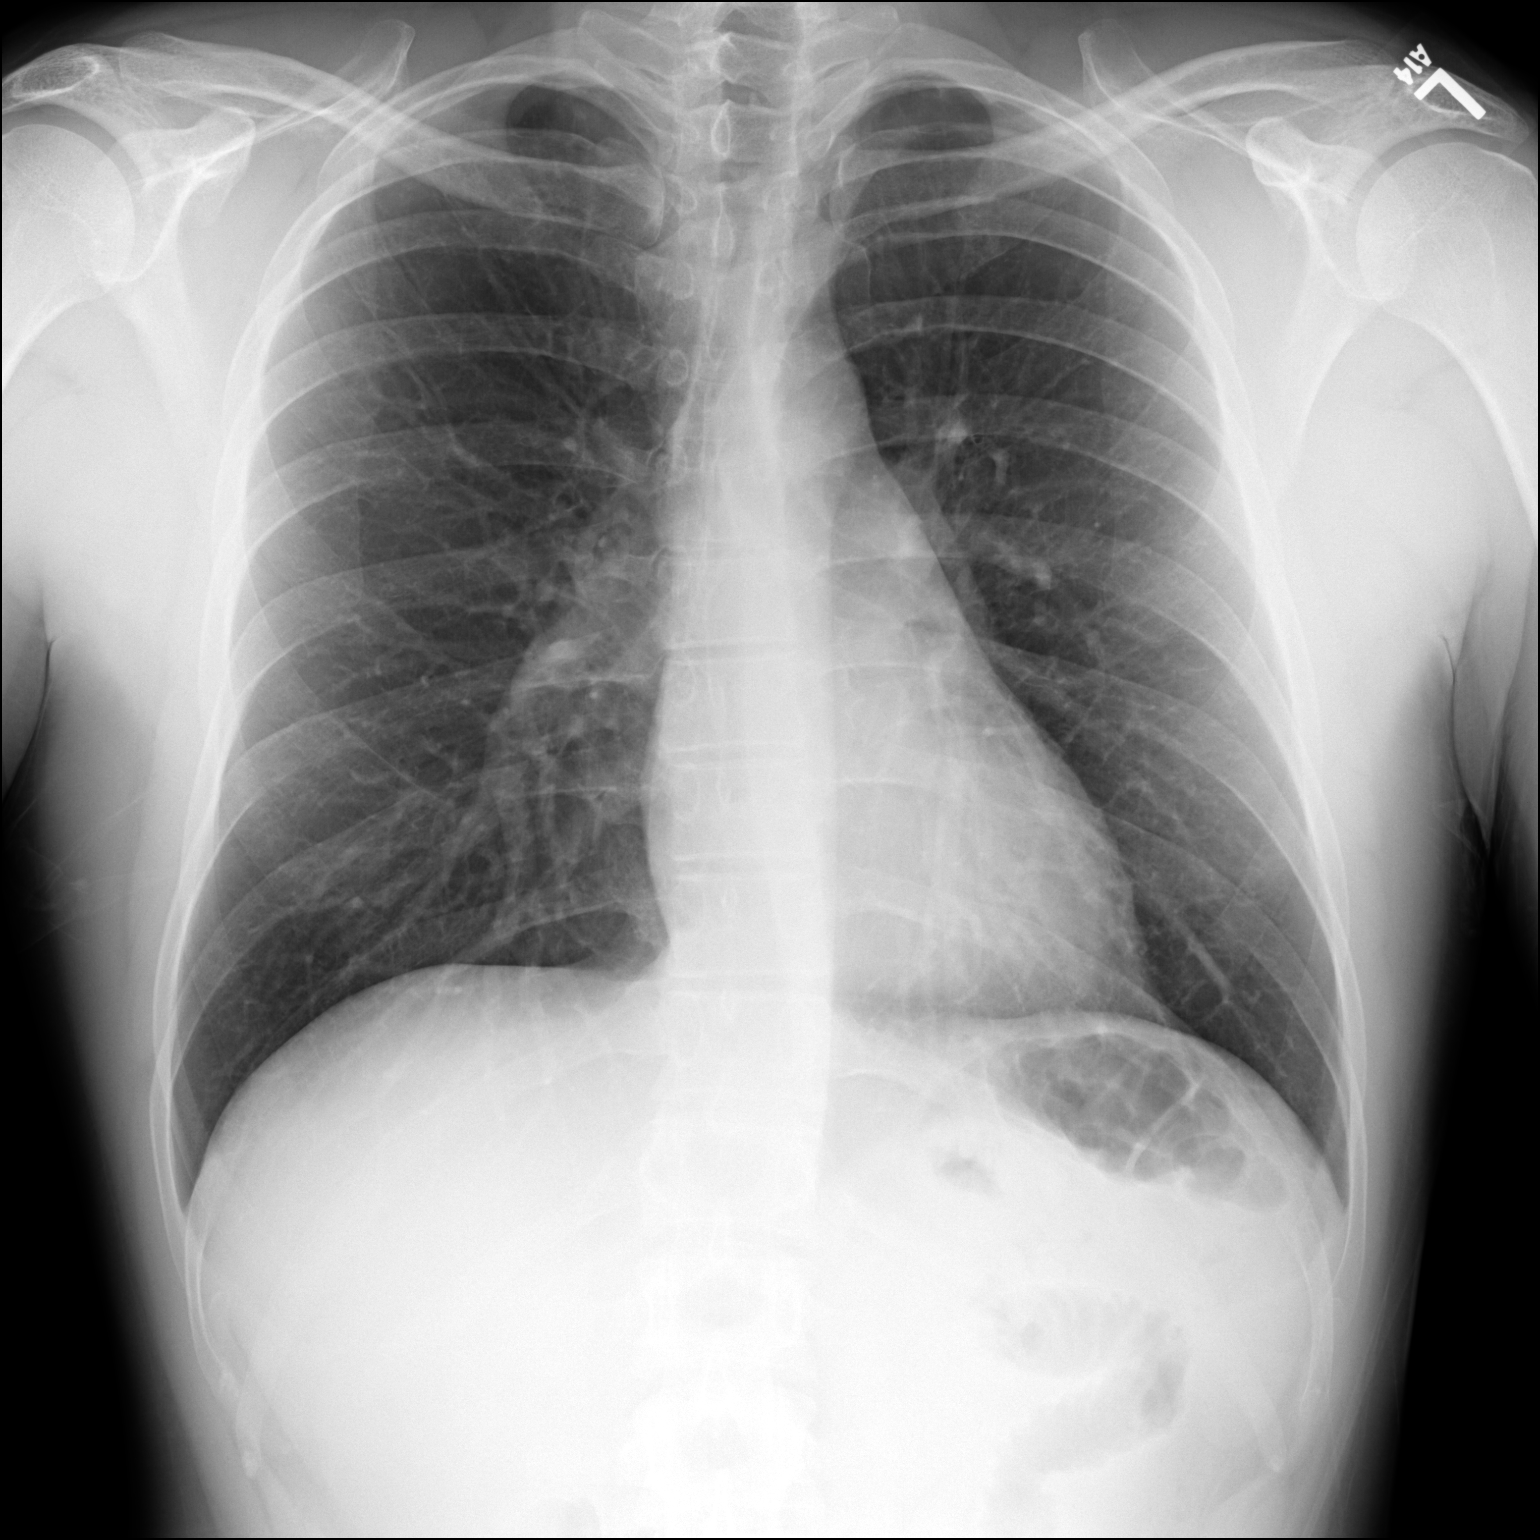

[dg chest 2 view (2 of 2)]
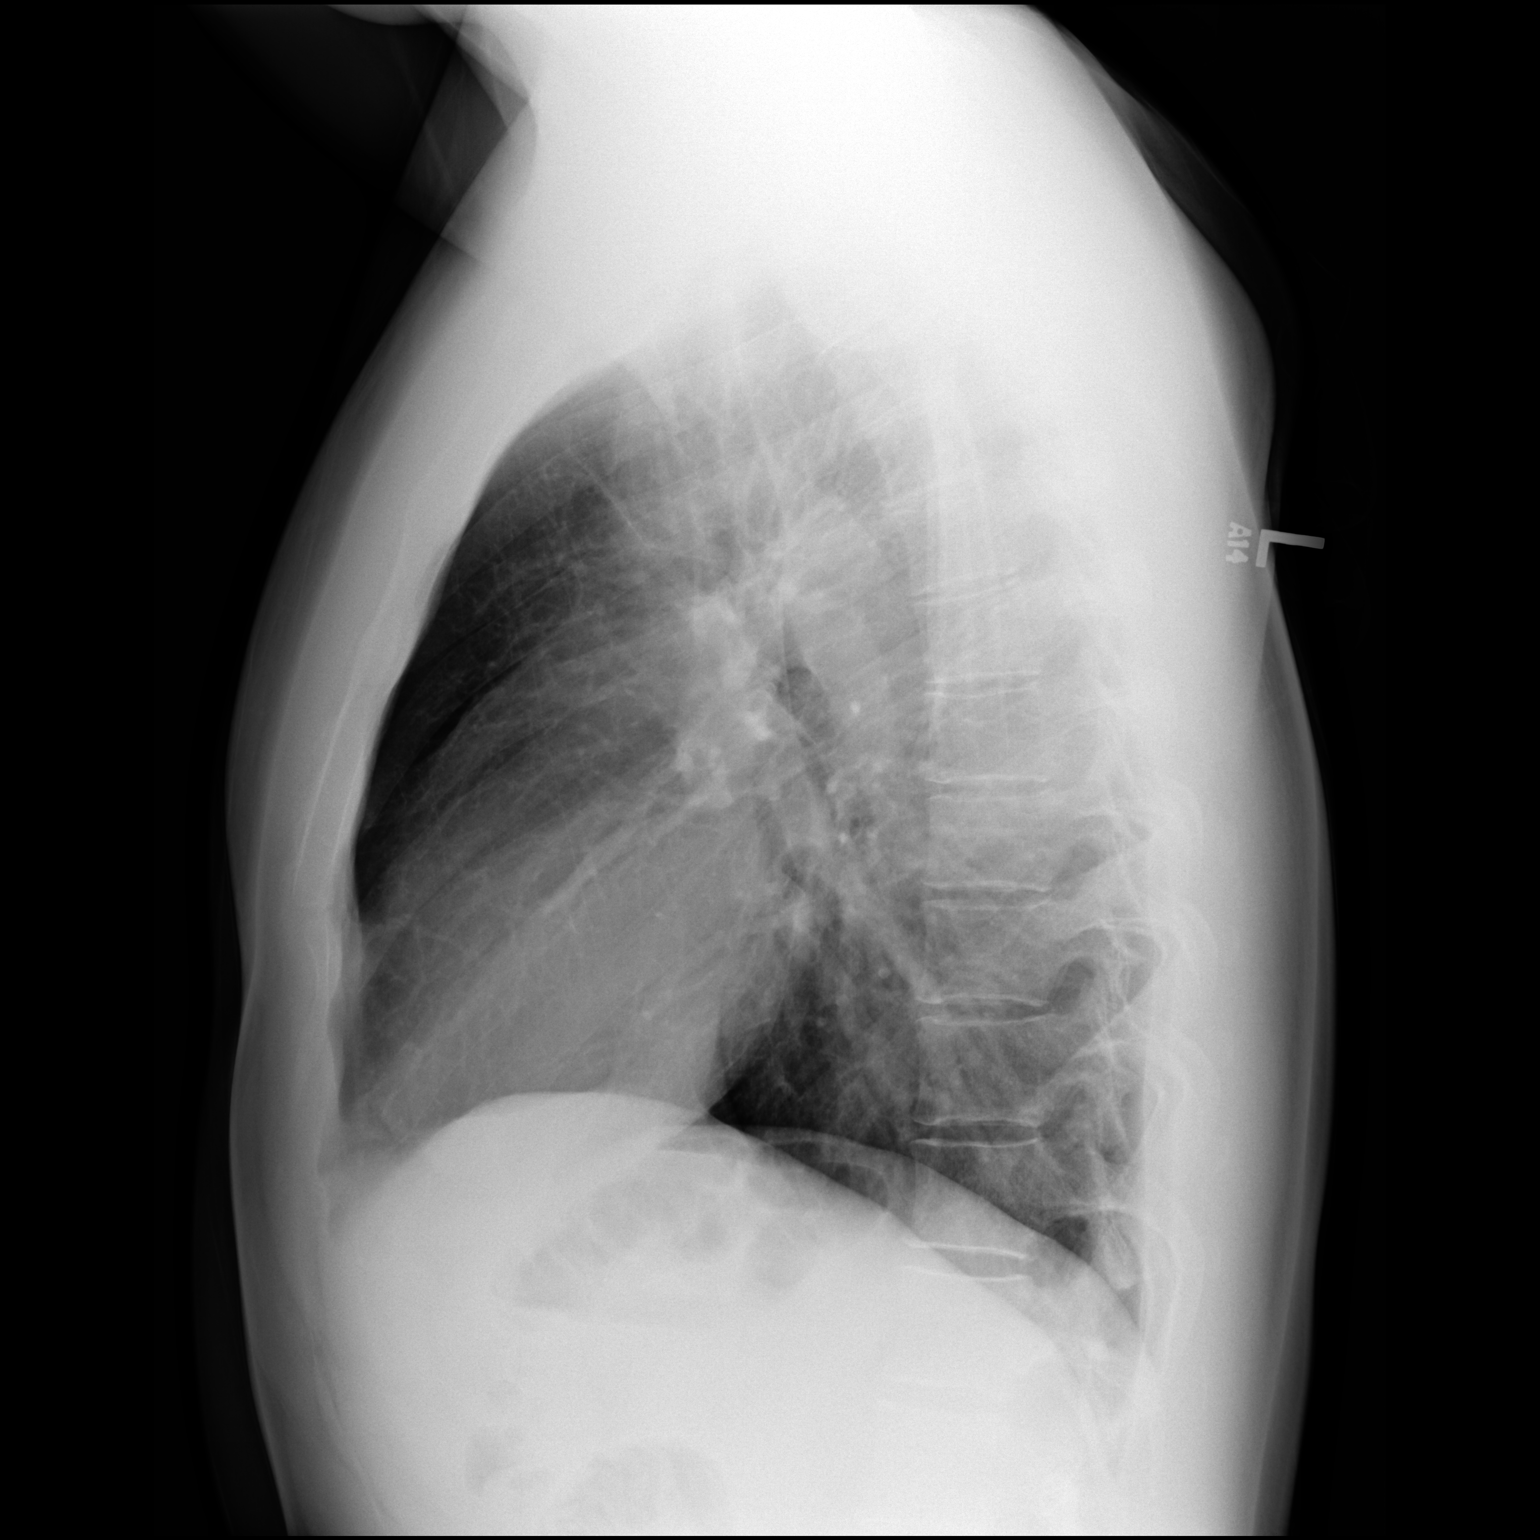

[2 of 2 positions shown; findings below may reference images not displayed]

FINDINGS: The heart size and mediastinal contours are within normal limits.
Both lungs are clear. No pneumothorax or pleural effusion is noted.
The visualized skeletal structures are unremarkable.
IMPRESSION: No active cardiopulmonary disease.

## 2017-03-29 ENCOUNTER — Telehealth: Payer: Self-pay

## 2017-03-29 DIAGNOSIS — M51369 Other intervertebral disc degeneration, lumbar region without mention of lumbar back pain or lower extremity pain: Secondary | ICD-10-CM

## 2017-03-29 DIAGNOSIS — M5136 Other intervertebral disc degeneration, lumbar region: Secondary | ICD-10-CM

## 2017-03-29 NOTE — Telephone Encounter (Signed)
Pt left VM stating he's having the same back pain after golfing and would like to know if you are willing to prescribe him the same meds for treatment. Please advise.

## 2017-03-30 MED ORDER — CYCLOBENZAPRINE HCL 10 MG PO TABS: ORAL_TABLET | ORAL | 0 refills | Status: DC

## 2017-03-30 MED ORDER — PREDNISONE 50 MG PO TABS: ORAL_TABLET | ORAL | 0 refills | Status: DC

## 2017-03-30 NOTE — Telephone Encounter (Signed)
Flexeril and a burst of prednisone sent in. Did he want anything else?

## 2017-03-30 NOTE — Telephone Encounter (Signed)
Left VM with information.  

## 2017-08-02 DIAGNOSIS — D224 Melanocytic nevi of scalp and neck: Secondary | ICD-10-CM | POA: Diagnosis not present

## 2017-08-02 DIAGNOSIS — D485 Neoplasm of uncertain behavior of skin: Secondary | ICD-10-CM | POA: Diagnosis not present

## 2017-08-02 DIAGNOSIS — Z808 Family history of malignant neoplasm of other organs or systems: Secondary | ICD-10-CM | POA: Diagnosis not present

## 2017-08-02 DIAGNOSIS — D2272 Melanocytic nevi of left lower limb, including hip: Secondary | ICD-10-CM | POA: Diagnosis not present

## 2017-08-02 DIAGNOSIS — Z86018 Personal history of other benign neoplasm: Secondary | ICD-10-CM | POA: Diagnosis not present

## 2017-08-02 DIAGNOSIS — D225 Melanocytic nevi of trunk: Secondary | ICD-10-CM | POA: Diagnosis not present

## 2018-04-13 DIAGNOSIS — J00 Acute nasopharyngitis [common cold]: Secondary | ICD-10-CM | POA: Diagnosis not present

## 2018-04-24 DIAGNOSIS — J0100 Acute maxillary sinusitis, unspecified: Secondary | ICD-10-CM | POA: Diagnosis not present

## 2018-06-03 ENCOUNTER — Other Ambulatory Visit: Payer: Self-pay | Admitting: Internal Medicine

## 2018-06-03 ENCOUNTER — Ambulatory Visit
Admission: RE | Admit: 2018-06-03 | Discharge: 2018-06-03 | Disposition: A | Discharge status: Home / Self Care | Payer: 59 | Source: Ambulatory Visit | Attending: Internal Medicine | Admitting: Internal Medicine

## 2018-06-03 DIAGNOSIS — C6290 Malignant neoplasm of unspecified testis, unspecified whether descended or undescended: Secondary | ICD-10-CM | POA: Diagnosis not present

## 2018-06-03 DIAGNOSIS — M25551 Pain in right hip: Secondary | ICD-10-CM

## 2018-06-03 DIAGNOSIS — R69 Illness, unspecified: Secondary | ICD-10-CM | POA: Diagnosis not present

## 2018-06-03 DIAGNOSIS — Z1389 Encounter for screening for other disorder: Secondary | ICD-10-CM | POA: Diagnosis not present

## 2018-06-03 DIAGNOSIS — M25561 Pain in right knee: Secondary | ICD-10-CM

## 2018-06-03 DIAGNOSIS — Z0000 Encounter for general adult medical examination without abnormal findings: Secondary | ICD-10-CM | POA: Diagnosis not present

## 2018-06-03 DIAGNOSIS — Z13220 Encounter for screening for lipoid disorders: Secondary | ICD-10-CM | POA: Diagnosis not present

## 2018-06-03 DIAGNOSIS — J309 Allergic rhinitis, unspecified: Secondary | ICD-10-CM | POA: Diagnosis not present

## 2018-06-03 IMAGING — CR DG KNEE COMPLETE 4+V*R*
4 series · 4 of 4 positions shown · non-contrast
Comparison: None.

CLINICAL DATA: 32-year-old male with off and on right knee and hip
pain worse over the past year. No injury. History of testicular
cancer. Initial encounter.

EXAM:
RIGHT KNEE - COMPLETE 4+ VIEW

[t knee ap right]
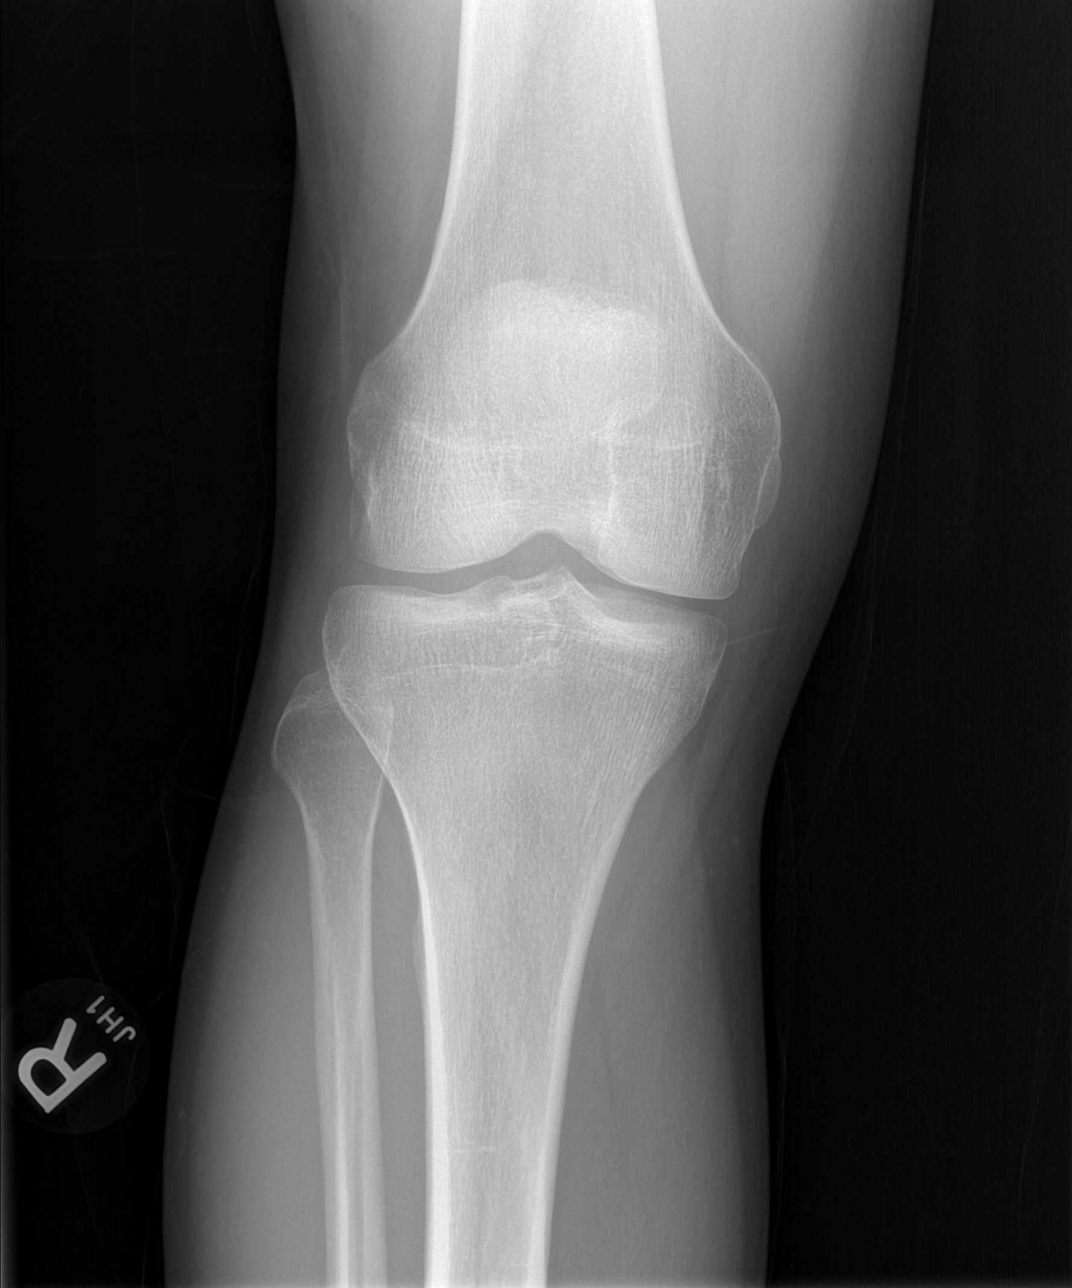

[t knee oblique right (1 of 2)]
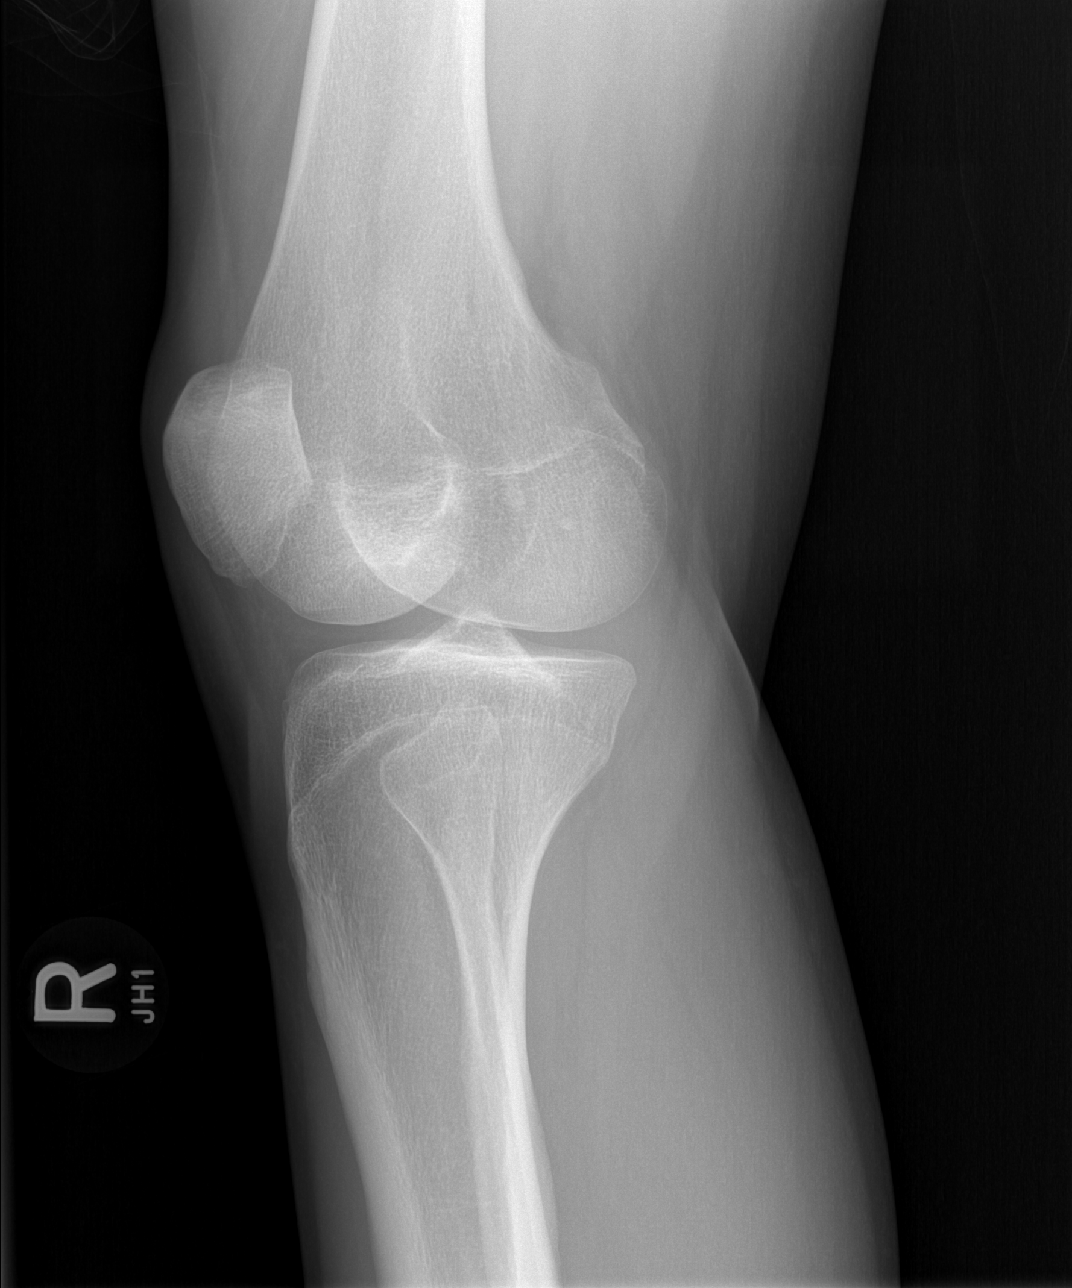

[t knee oblique right (2 of 2)]
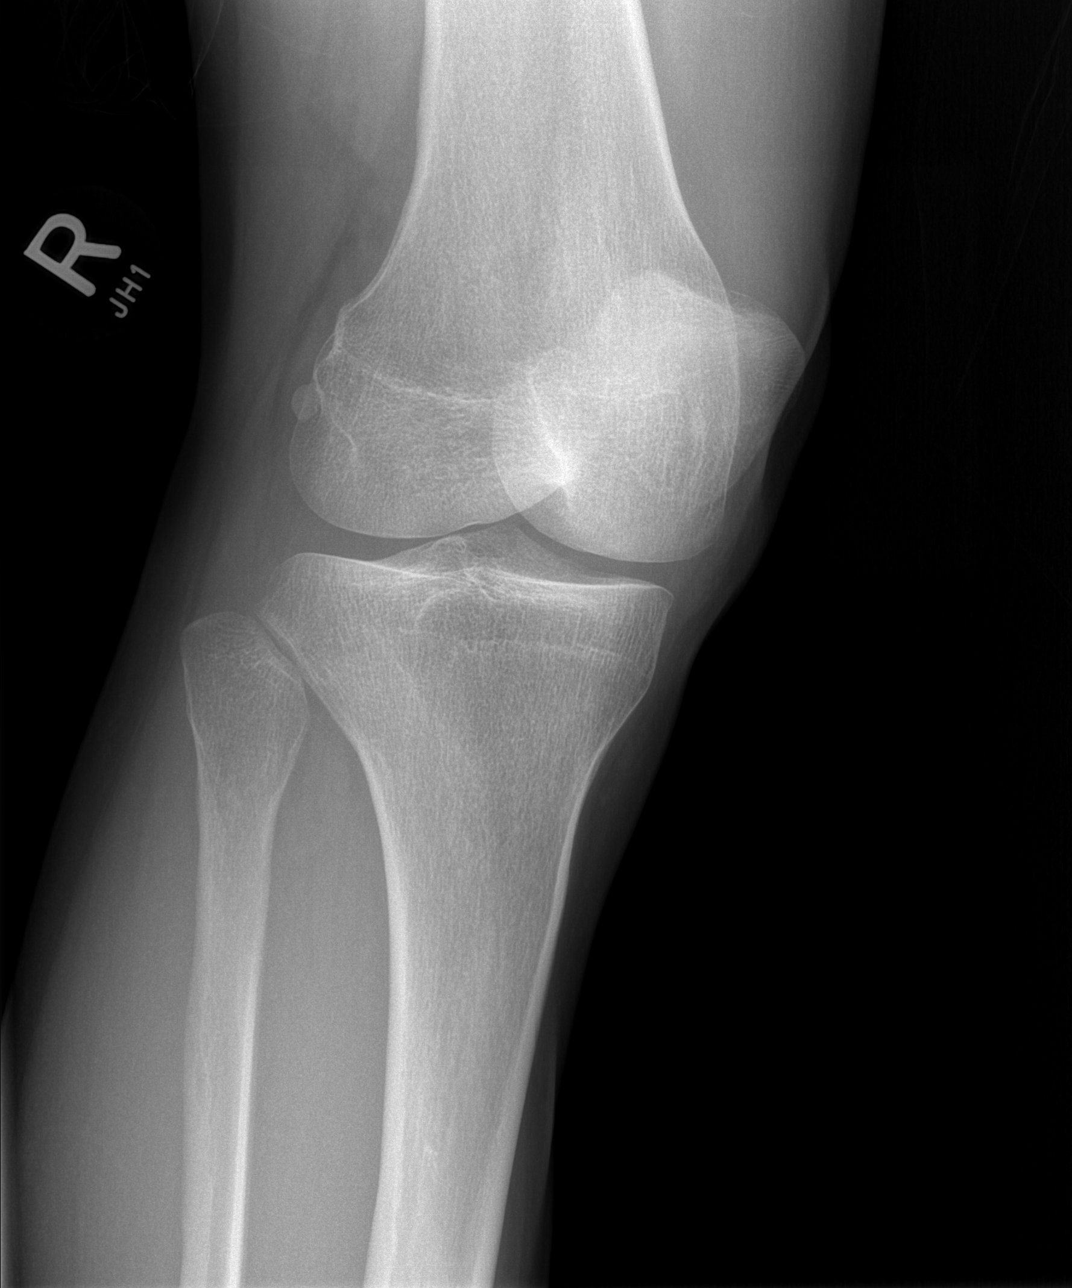

[t knee lat right]
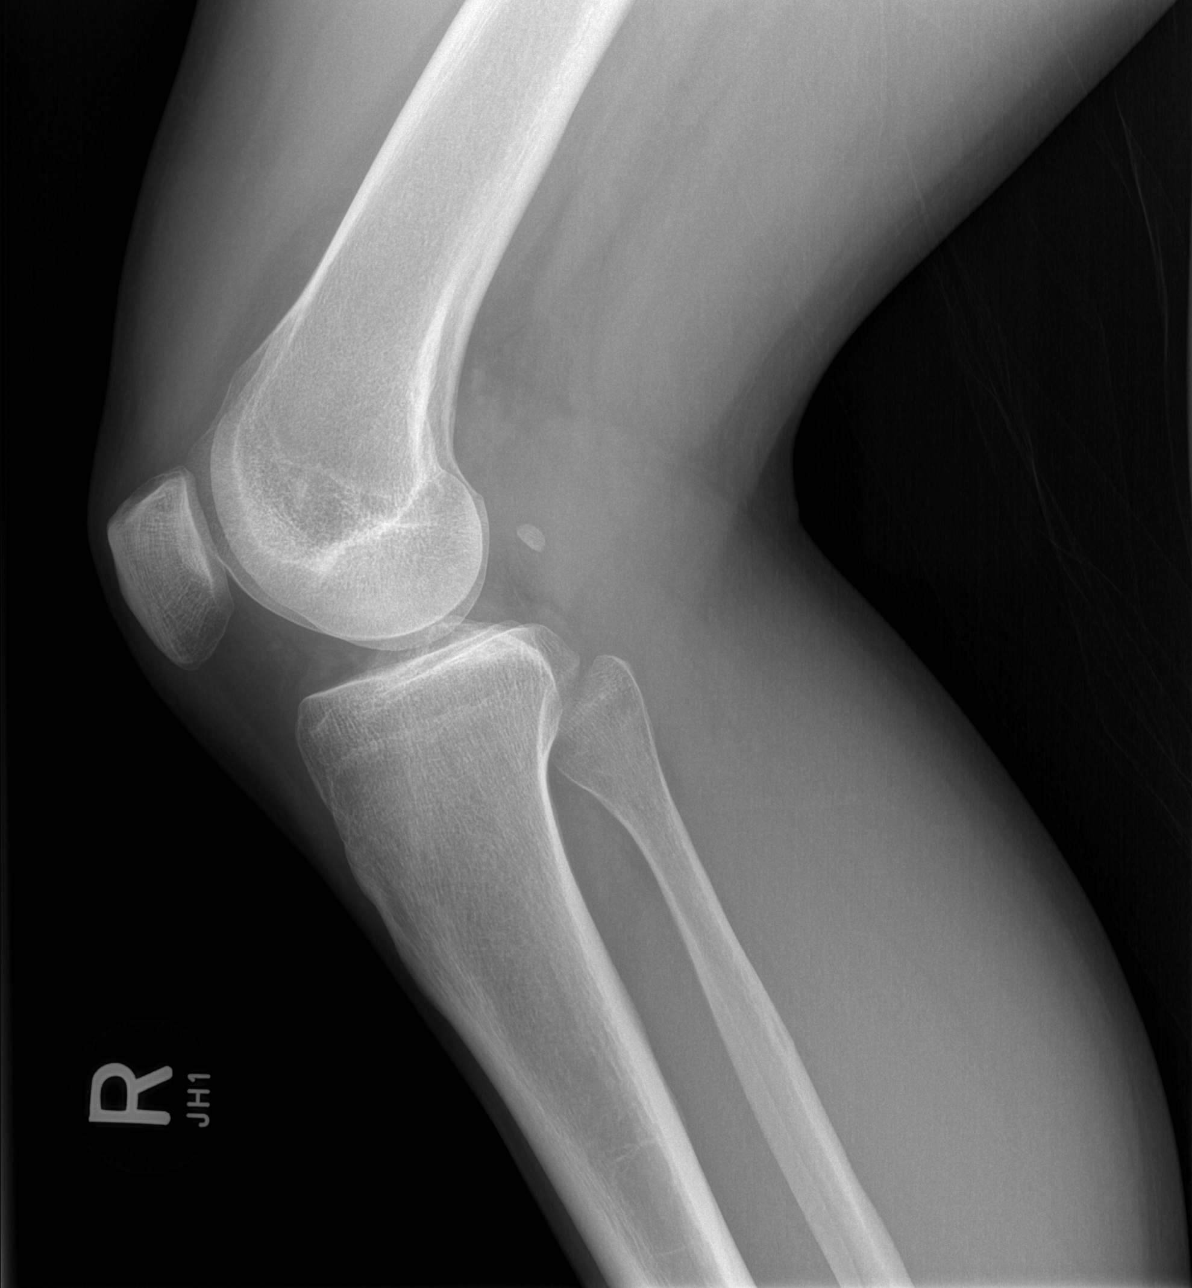

[4 of 4 positions shown; findings below may reference images not displayed]

FINDINGS: No significant joint space narrowing. No fracture or dislocation. No
plain film evidence of osseous destructive lesion. No evidence of
joint effusion.
IMPRESSION: Negative plain film examination of the right knee as detailed above.

## 2018-06-03 IMAGING — CR DG HIP (WITH OR WITHOUT PELVIS) 2-3V*R*
2 series · 2 of 2 positions shown · non-contrast
Comparison: [DATE] CT.

CLINICAL DATA: 32-year-old male with off and on right knee and hip
pain worse over the past year. No injury. History of testicular
cancer. Initial encounter.

EXAM:
DG HIP (WITH OR WITHOUT PELVIS) 2-3V RIGHT

[t hip ap right]
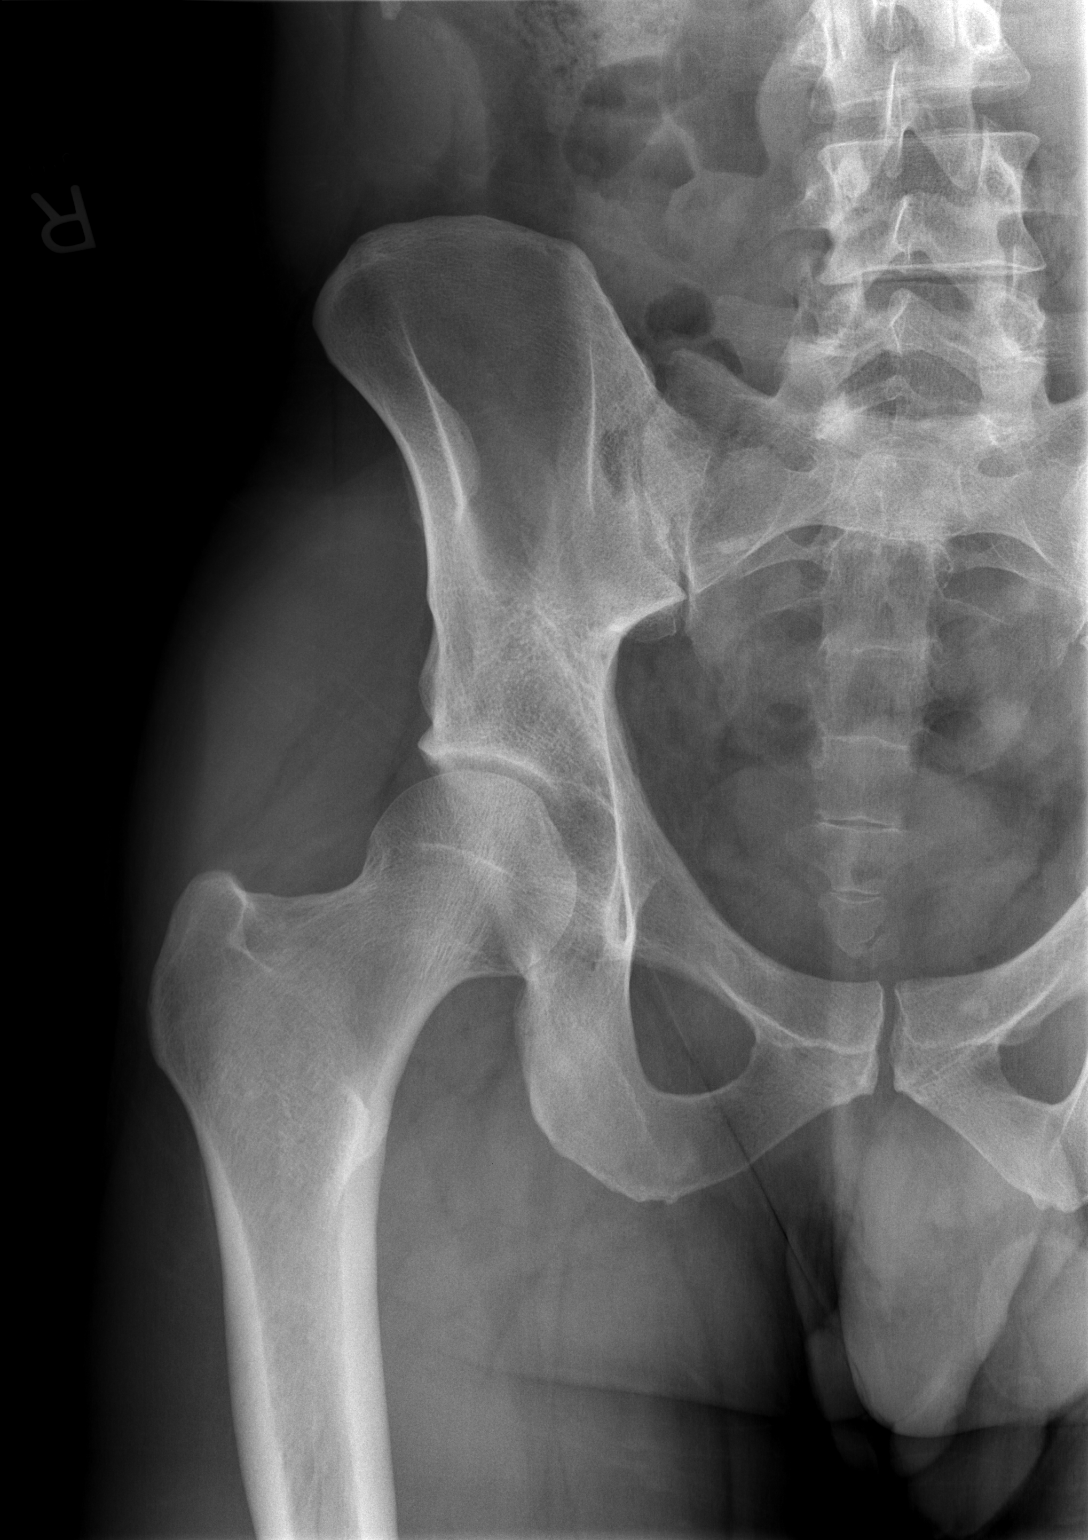

[t hip frog leg right]
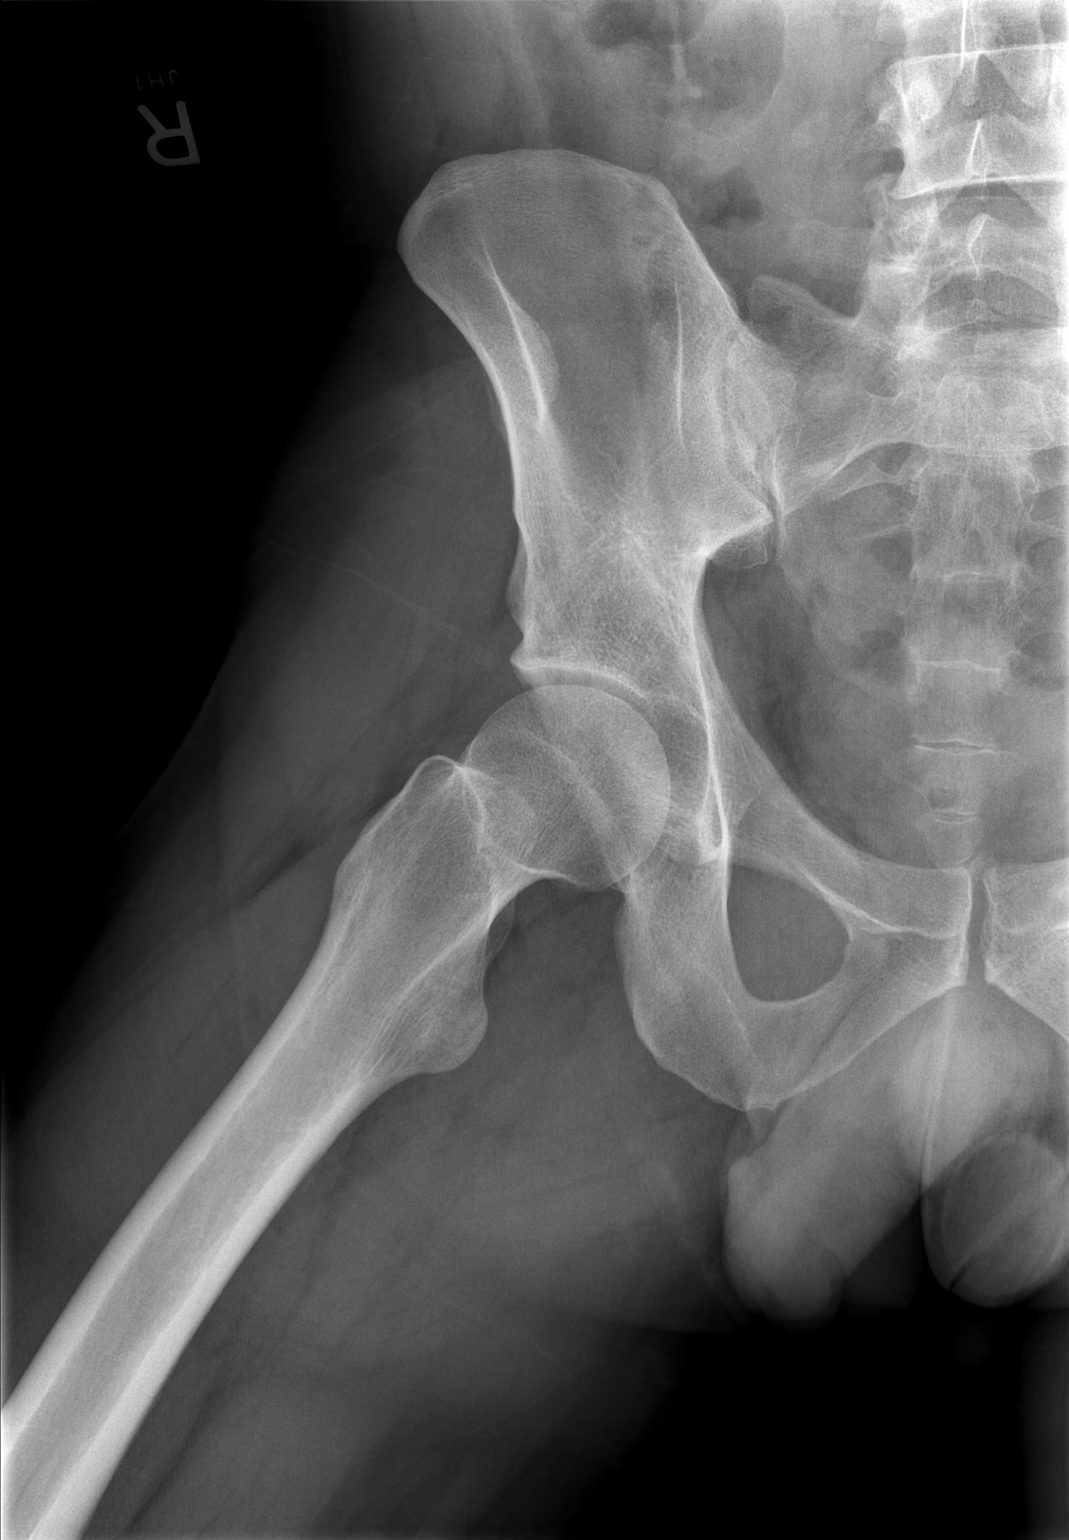

[2 of 2 positions shown; findings below may reference images not displayed]

FINDINGS: No significant right hip joint degenerative changes, plain film
evidence of femoral head avascular necrosis, fracture or
dislocation. No plain film evidence of osseous destructive lesion.
Mild degenerative changes lower lumbar spine incompletely assessed.
Left testicular prosthesis in place.
IMPRESSION: 1. Negative plain film examination of right hip as detailed above.
2. Mild degenerative changes lower lumbar spine incompletely
assessed.

## 2018-06-17 ENCOUNTER — Ambulatory Visit
Admission: RE | Admit: 2018-06-17 | Discharge: 2018-06-17 | Disposition: A | Discharge status: Home / Self Care | Payer: 59 | Source: Ambulatory Visit | Attending: Internal Medicine | Admitting: Internal Medicine

## 2018-06-17 ENCOUNTER — Other Ambulatory Visit: Payer: Self-pay | Admitting: Internal Medicine

## 2018-06-17 DIAGNOSIS — Z8547 Personal history of malignant neoplasm of testis: Secondary | ICD-10-CM | POA: Diagnosis not present

## 2018-06-17 DIAGNOSIS — M519 Unspecified thoracic, thoracolumbar and lumbosacral intervertebral disc disorder: Secondary | ICD-10-CM

## 2018-06-17 DIAGNOSIS — M5136 Other intervertebral disc degeneration, lumbar region: Secondary | ICD-10-CM | POA: Diagnosis not present

## 2018-06-17 IMAGING — DX DG LUMBAR SPINE 2-3V
3 series · 3 of 3 positions shown · non-contrast
Comparison: CT scan of [DATE].

CLINICAL DATA: Chronic low back pain without known injury.

EXAM:
LUMBAR SPINE - 2-3 VIEW

[dg lumbar spine 2-3 views (1 of 3)]
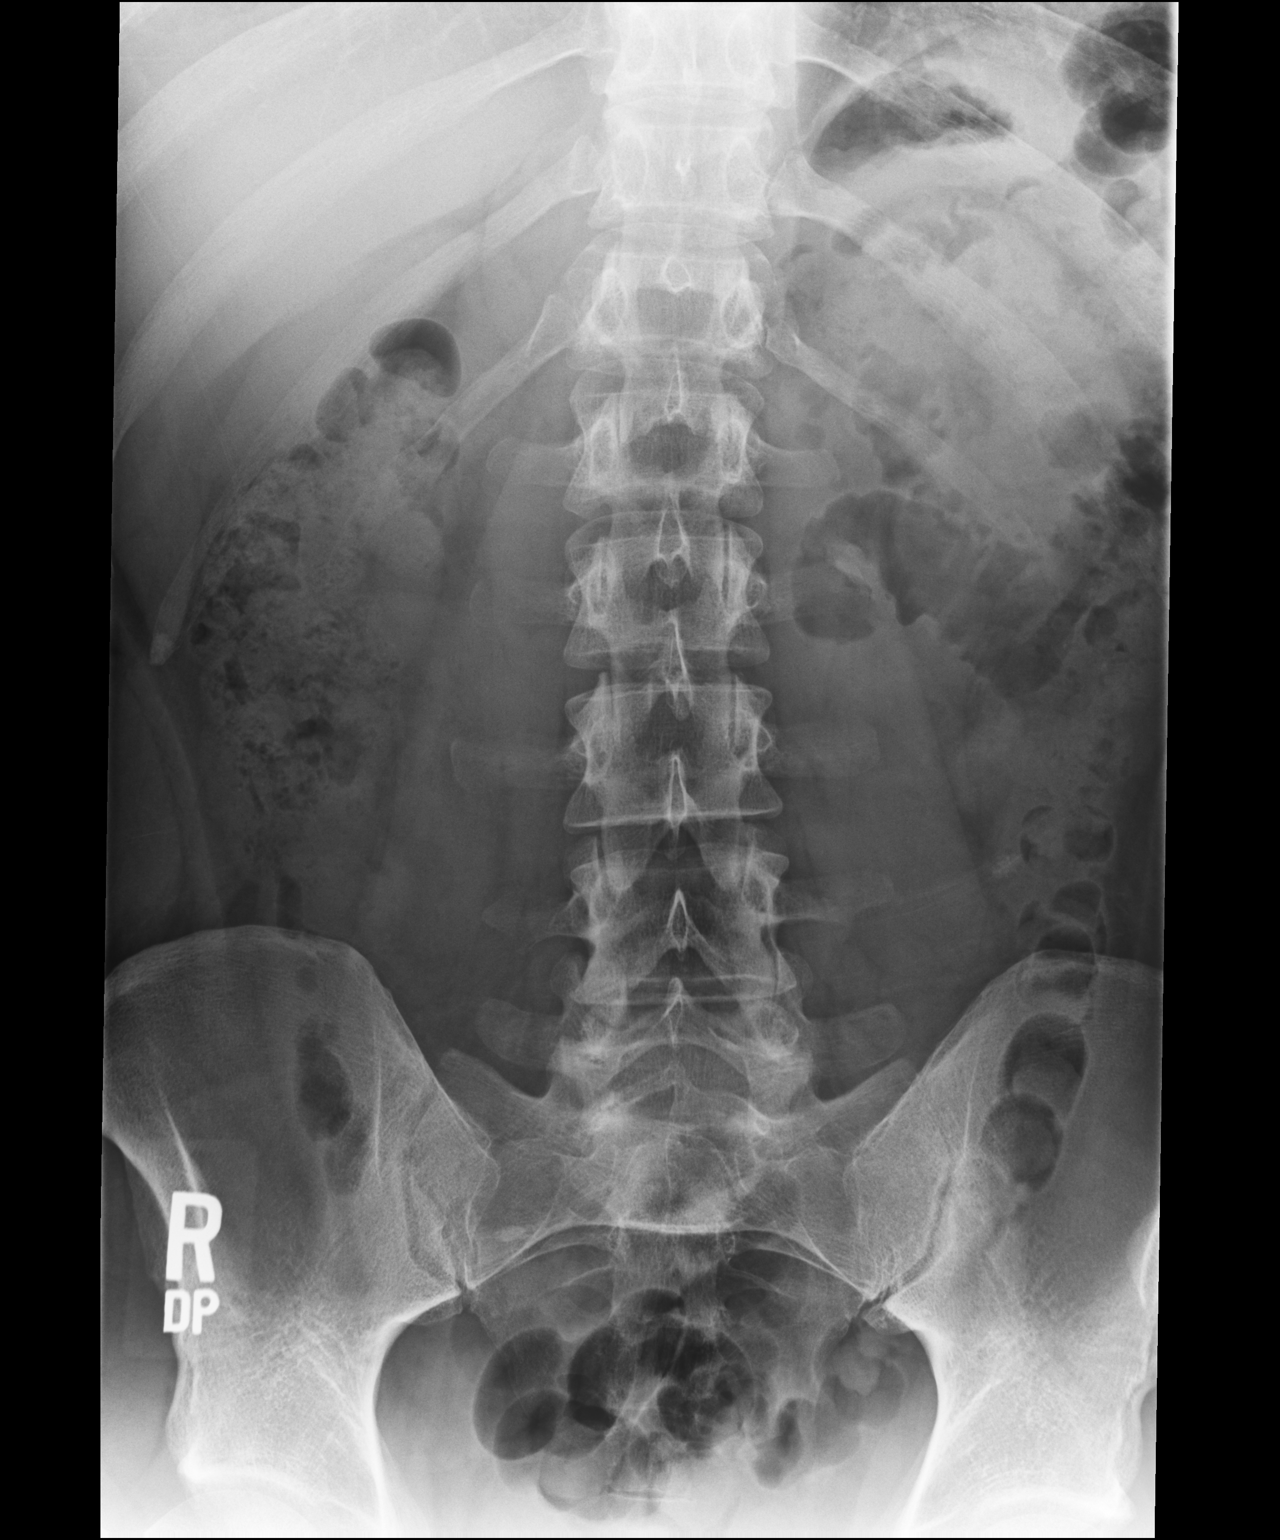

[dg lumbar spine 2-3 views (2 of 3)]
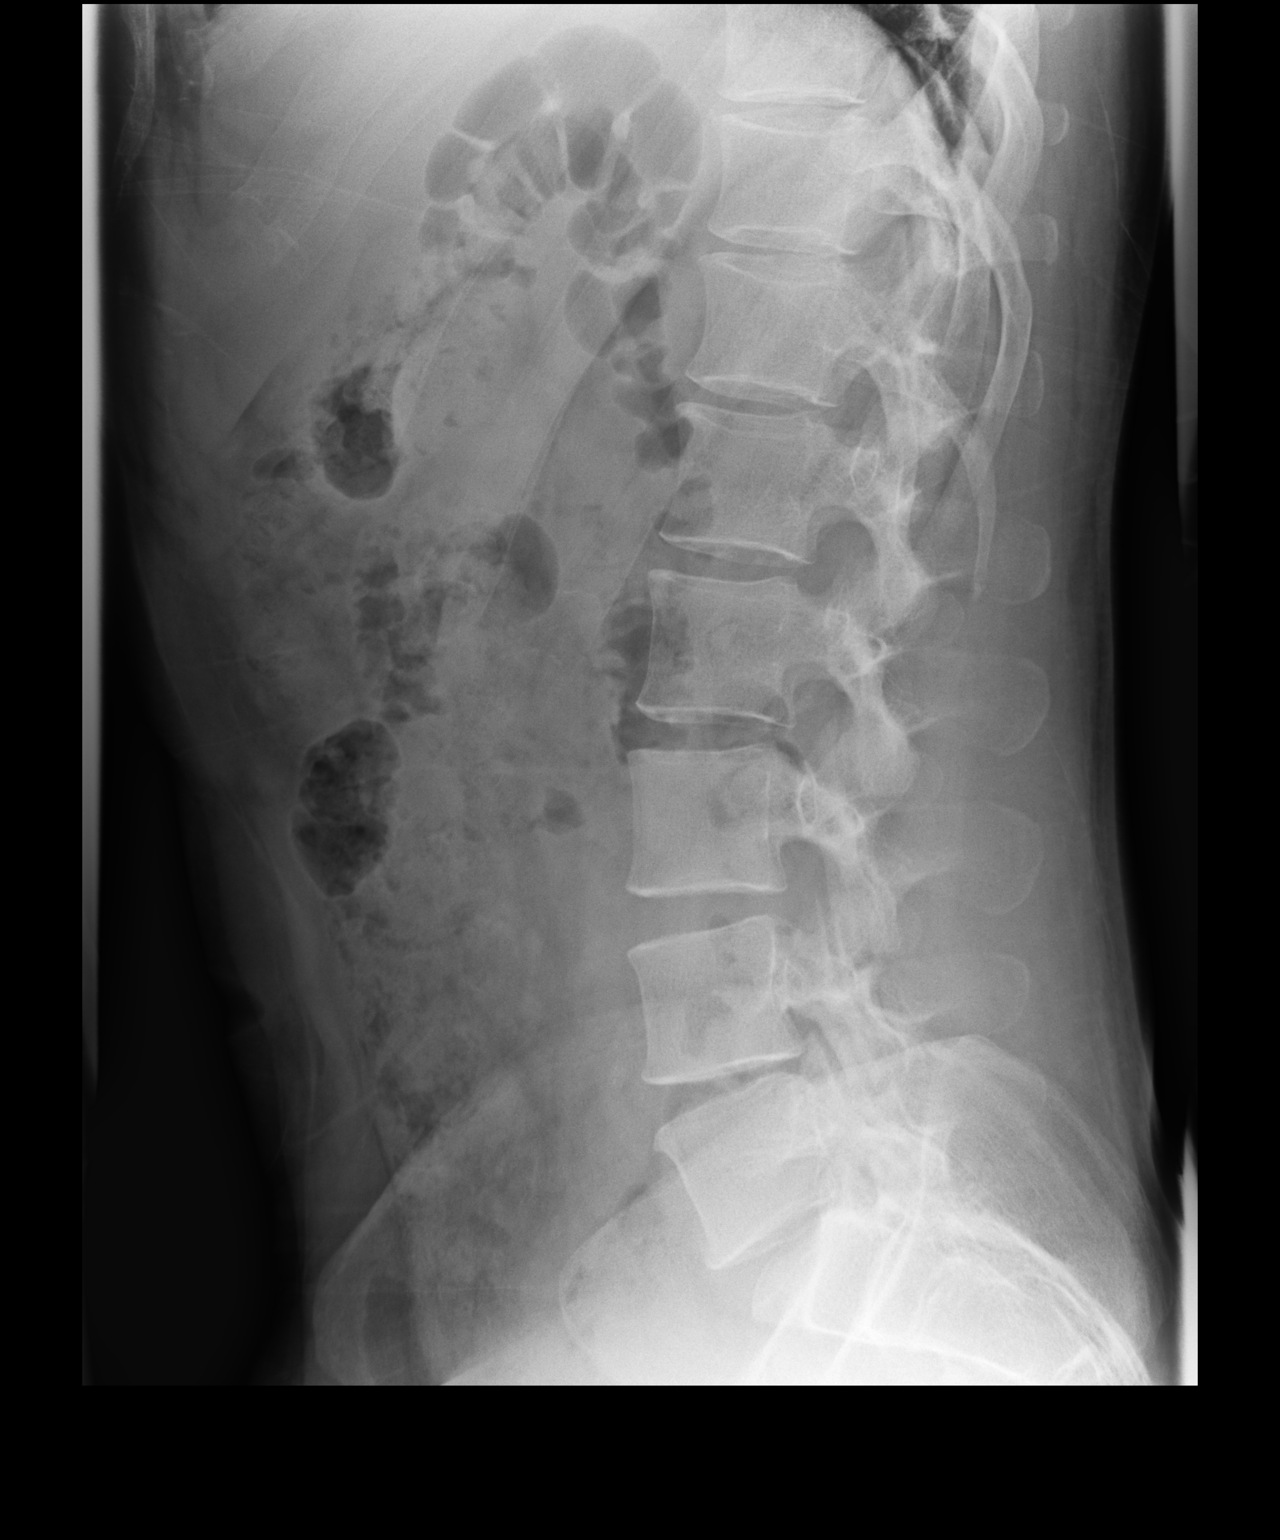

[dg lumbar spine 2-3 views (3 of 3)]
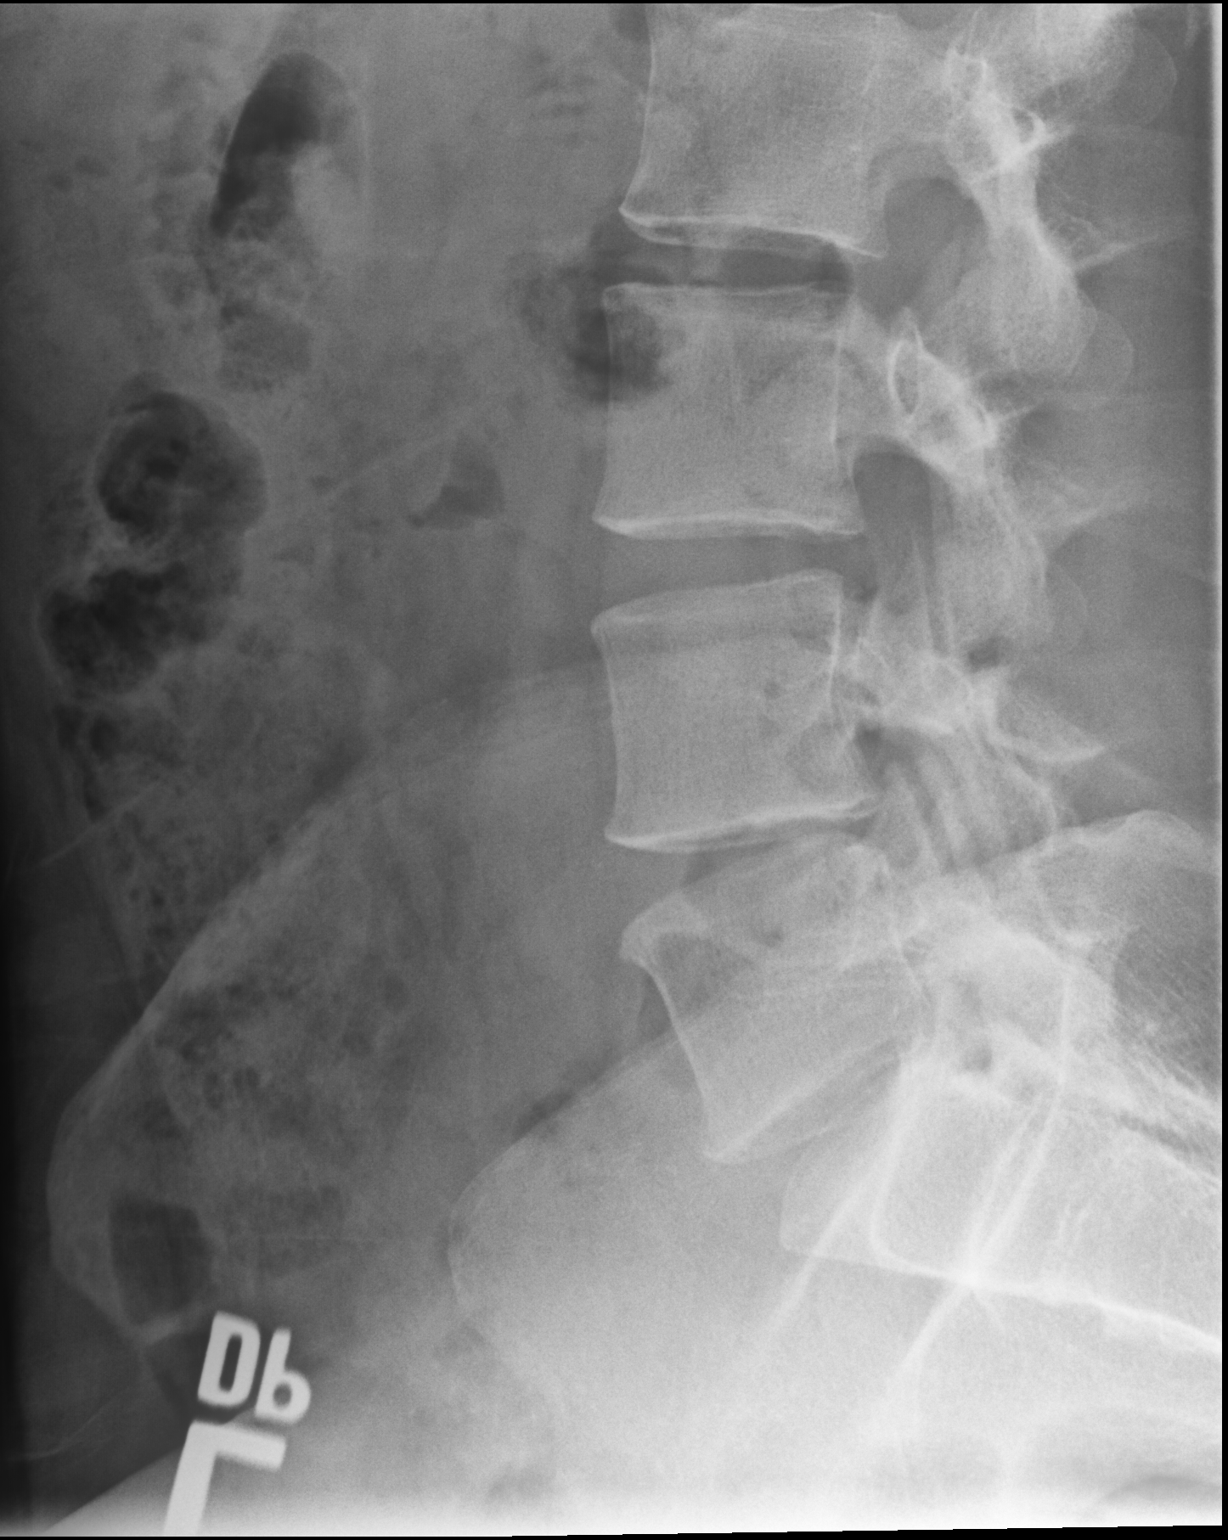

[3 of 3 positions shown; findings below may reference images not displayed]

FINDINGS: No fracture or spondylolisthesis is noted. Mild degenerative disc
disease is noted at L4-5. Remaining disc spaces are unremarkable.
IMPRESSION: Mild degenerative disc disease is noted at L4-5. No acute
abnormality seen in the lumbar spine.

## 2018-06-26 DIAGNOSIS — Z8547 Personal history of malignant neoplasm of testis: Secondary | ICD-10-CM | POA: Diagnosis not present

## 2018-09-05 DIAGNOSIS — M25561 Pain in right knee: Secondary | ICD-10-CM | POA: Diagnosis not present

## 2018-09-05 DIAGNOSIS — R040 Epistaxis: Secondary | ICD-10-CM | POA: Diagnosis not present

## 2018-09-10 ENCOUNTER — Encounter: Payer: 59 | Admitting: Sports Medicine

## 2018-09-17 DIAGNOSIS — Z7289 Other problems related to lifestyle: Secondary | ICD-10-CM | POA: Diagnosis not present

## 2018-09-17 DIAGNOSIS — R040 Epistaxis: Secondary | ICD-10-CM | POA: Diagnosis not present

## 2019-02-26 ENCOUNTER — Ambulatory Visit (INDEPENDENT_AMBULATORY_CARE_PROVIDER_SITE_OTHER): Payer: 59 | Admitting: Sports Medicine

## 2019-02-26 ENCOUNTER — Other Ambulatory Visit: Payer: Self-pay

## 2019-02-26 ENCOUNTER — Encounter: Payer: Self-pay | Admitting: Sports Medicine

## 2019-02-26 DIAGNOSIS — M51369 Other intervertebral disc degeneration, lumbar region without mention of lumbar back pain or lower extremity pain: Secondary | ICD-10-CM

## 2019-02-26 DIAGNOSIS — M5136 Other intervertebral disc degeneration, lumbar region: Secondary | ICD-10-CM | POA: Diagnosis not present

## 2019-02-26 MED ORDER — PREDNISONE 50 MG PO TABS: ORAL_TABLET | ORAL | 0 refills | Status: DC

## 2019-02-26 MED ORDER — CYCLOBENZAPRINE HCL 10 MG PO TABS: ORAL_TABLET | ORAL | 3 refills | Status: DC

## 2019-02-26 NOTE — Assessment & Plan Note (Signed)
Christopher Swanson has L4-S1 lumbar DDD on x-rays, he did extremely well 2 years ago with a burst of prednisone and occasional Flexeril. He is having recurrence of discomfort, similar in nature, he has been doing his rehab exercises and has persistent discomfort. He is going to a wedding this weekend and has to play 3 days of golf, adding prednisone, Flexeril, he is going to call back and get into see me for his knee at some point as well.

## 2019-02-26 NOTE — Progress Notes (Signed)
Virtual Visit via WebEx/MyChart   I connected with  Christopher Swanson  on 02/26/19 via WebEx/MyChart/Doximity Video and verified that I am speaking with the correct person using two identifiers.   I discussed the limitations, risks, security and privacy concerns of performing an evaluation and management service by WebEx/MyChart/Doximity Video, including the higher likelihood of inaccurate diagnosis and treatment, and the availability of in person appointments.  We also discussed the likely need of an additional face to face encounter for complete and high quality delivery of care.  I also discussed with the patient that there may be a patient responsible charge related to this service. The patient expressed understanding and wishes to proceed.  Provider location is either at home or medical facility. Patient location is at their home, different from provider location. People involved in care of the patient during this telehealth encounter were myself, my nurse/medical assistant, and my front office/scheduling team member.  Subjective:    CC: Low back pain  HPI: Christopher Swanson is a very pleasant 33 year old male, he is having recurrence of low back pain, this responded extremely well to prednisone and Flexeril several years ago.  He has a long weekend of golf and a wedding coming up, he denies bowel or bladder dysfunction, no saddle numbness, no constitutional symptoms, no trauma.  I reviewed the past medical history, family history, social history, surgical history, and allergies today and no changes were needed.  Please see the problem list section below in epic for further details.  Past Medical History: Past Medical History:  Diagnosis Date  . ADD (attention deficit disorder)   . Testicular tumor    left   Past Surgical History: Past Surgical History:  Procedure Laterality Date  . CIRCUMCISION  AGE 15  . ORCHIECTOMY Left 12/16/2012   Procedure: ORCHIECTOMY;  Surgeon: Bernestine Amass, MD;   Location: [facility];  Service: Urology;  Laterality: Left;  . TESTICULAR PROSTHETIC INSERTION Left 12/16/2012   Procedure: INSERTION TESTICULAR PROSTHESIS;  Surgeon: Bernestine Amass, MD;  Location: [facility];  Service: Urology;  Laterality: Left;  . WISDOM TOOTH EXTRACTION  AGE 37   Social History: Social History   Socioeconomic History  . Marital status: Single    Spouse name: Not on file  . Number of children: Not on file  . Years of education: Not on file  . Highest education level: Not on file  Occupational History  . Not on file  Social Needs  . Financial resource strain: Not on file  . Food insecurity    Worry: Not on file    Inability: Not on file  . Transportation needs    Medical: Not on file    Non-medical: Not on file  Tobacco Use  . Smoking status: Never Smoker  . Smokeless tobacco: Never Used  Substance and Sexual Activity  . Alcohol use: Yes    Alcohol/week: 5.0 standard drinks    Types: 3 Cans of beer, 2 Glasses of wine per week    Comment: WEEKENDS  . Drug use: No  . Sexual activity: Not on file  Lifestyle  . Physical activity    Days per week: Not on file    Minutes per session: Not on file  . Stress: Not on file  Relationships  . Social Herbalist on phone: Not on file    Gets together: Not on file    Attends religious service: Not on file    Active  member of club or organization: Not on file    Attends meetings of clubs or organizations: Not on file    Relationship status: Not on file  Other Topics Concern  . Not on file  Social History Narrative  . Not on file   Family History: No family history on file. Allergies: Allergies  Allergen Reactions  . Pseudoephedrine Nausea And Vomiting   Medications: See med rec.  Review of Systems: No fevers, chills, night sweats, weight loss, chest pain, or shortness of breath.   Objective:    General: Speaking full sentences, no audible heavy breathing.   Sounds alert and appropriately interactive.  Appears well.  Face symmetric.  Extraocular movements intact.  Pupils equal and round.  No nasal flaring or accessory muscle use visualized.  No other physical exam performed due to the non-physical nature of this visit.  Impression and Recommendations:    Lumbar degenerative disc disease Christopher Swanson has L4-S1 lumbar DDD on x-rays, he did extremely well 2 years ago with a burst of prednisone and occasional Flexeril. He is having recurrence of discomfort, similar in nature, he has been doing his rehab exercises and has persistent discomfort. He is going to a wedding this weekend and has to play 3 days of golf, adding prednisone, Flexeril, he is going to call back and get into see me for his knee at some point as well.   I discussed the above assessment and treatment plan with the patient. The patient was provided an opportunity to ask questions and all were answered. The patient agreed with the plan and demonstrated an understanding of the instructions.   The patient was advised to call back or seek an in-person evaluation if the symptoms worsen or if the condition fails to improve as anticipated.   I provided 25 minutes of non-face-to-face time during this encounter, 15 minutes of additional time was needed to gather information, review chart, records, communicate/coordinate with staff remotely, troubleshooting the multiple errors that we get every time when trying to do video calls through the electronic medical record, WebEx, and Doximity, restart the encounter multiple times due to instability of the software, as well as complete documentation.   ___________________________________________ Gwen Her. Dianah Field, M.D., ABFM., CAQSM. Primary Care and Sports Medicine [facility]  Adjunct Professor of [place] [employer] [facility] of [employer]

## 2019-04-04 DIAGNOSIS — M5137 Other intervertebral disc degeneration, lumbosacral region: Secondary | ICD-10-CM | POA: Diagnosis not present

## 2019-04-04 DIAGNOSIS — M62830 Muscle spasm of back: Secondary | ICD-10-CM | POA: Diagnosis not present

## 2019-04-04 DIAGNOSIS — M5386 Other specified dorsopathies, lumbar region: Secondary | ICD-10-CM | POA: Diagnosis not present

## 2019-04-04 DIAGNOSIS — M9905 Segmental and somatic dysfunction of pelvic region: Secondary | ICD-10-CM | POA: Diagnosis not present

## 2019-04-04 DIAGNOSIS — M9902 Segmental and somatic dysfunction of thoracic region: Secondary | ICD-10-CM | POA: Diagnosis not present

## 2019-04-04 DIAGNOSIS — M9903 Segmental and somatic dysfunction of lumbar region: Secondary | ICD-10-CM | POA: Diagnosis not present

## 2019-04-07 DIAGNOSIS — M5386 Other specified dorsopathies, lumbar region: Secondary | ICD-10-CM | POA: Diagnosis not present

## 2019-04-07 DIAGNOSIS — M9905 Segmental and somatic dysfunction of pelvic region: Secondary | ICD-10-CM | POA: Diagnosis not present

## 2019-04-07 DIAGNOSIS — M62830 Muscle spasm of back: Secondary | ICD-10-CM | POA: Diagnosis not present

## 2019-04-07 DIAGNOSIS — M9903 Segmental and somatic dysfunction of lumbar region: Secondary | ICD-10-CM | POA: Diagnosis not present

## 2019-04-07 DIAGNOSIS — M9902 Segmental and somatic dysfunction of thoracic region: Secondary | ICD-10-CM | POA: Diagnosis not present

## 2019-04-07 DIAGNOSIS — M5137 Other intervertebral disc degeneration, lumbosacral region: Secondary | ICD-10-CM | POA: Diagnosis not present

## 2019-04-14 DIAGNOSIS — M62830 Muscle spasm of back: Secondary | ICD-10-CM | POA: Diagnosis not present

## 2019-04-14 DIAGNOSIS — M9905 Segmental and somatic dysfunction of pelvic region: Secondary | ICD-10-CM | POA: Diagnosis not present

## 2019-04-14 DIAGNOSIS — M9903 Segmental and somatic dysfunction of lumbar region: Secondary | ICD-10-CM | POA: Diagnosis not present

## 2019-04-14 DIAGNOSIS — M5137 Other intervertebral disc degeneration, lumbosacral region: Secondary | ICD-10-CM | POA: Diagnosis not present

## 2019-04-14 DIAGNOSIS — M9902 Segmental and somatic dysfunction of thoracic region: Secondary | ICD-10-CM | POA: Diagnosis not present

## 2019-04-14 DIAGNOSIS — M5386 Other specified dorsopathies, lumbar region: Secondary | ICD-10-CM | POA: Diagnosis not present

## 2019-04-21 DIAGNOSIS — M9905 Segmental and somatic dysfunction of pelvic region: Secondary | ICD-10-CM | POA: Diagnosis not present

## 2019-04-21 DIAGNOSIS — M9903 Segmental and somatic dysfunction of lumbar region: Secondary | ICD-10-CM | POA: Diagnosis not present

## 2019-04-21 DIAGNOSIS — M5137 Other intervertebral disc degeneration, lumbosacral region: Secondary | ICD-10-CM | POA: Diagnosis not present

## 2019-04-21 DIAGNOSIS — M62830 Muscle spasm of back: Secondary | ICD-10-CM | POA: Diagnosis not present

## 2019-04-21 DIAGNOSIS — M9902 Segmental and somatic dysfunction of thoracic region: Secondary | ICD-10-CM | POA: Diagnosis not present

## 2019-04-21 DIAGNOSIS — M5386 Other specified dorsopathies, lumbar region: Secondary | ICD-10-CM | POA: Diagnosis not present

## 2019-04-28 DIAGNOSIS — M9905 Segmental and somatic dysfunction of pelvic region: Secondary | ICD-10-CM | POA: Diagnosis not present

## 2019-04-28 DIAGNOSIS — M5137 Other intervertebral disc degeneration, lumbosacral region: Secondary | ICD-10-CM | POA: Diagnosis not present

## 2019-04-28 DIAGNOSIS — M5386 Other specified dorsopathies, lumbar region: Secondary | ICD-10-CM | POA: Diagnosis not present

## 2019-04-28 DIAGNOSIS — M62830 Muscle spasm of back: Secondary | ICD-10-CM | POA: Diagnosis not present

## 2019-04-28 DIAGNOSIS — M9902 Segmental and somatic dysfunction of thoracic region: Secondary | ICD-10-CM | POA: Diagnosis not present

## 2019-04-28 DIAGNOSIS — M9903 Segmental and somatic dysfunction of lumbar region: Secondary | ICD-10-CM | POA: Diagnosis not present

## 2019-05-20 DIAGNOSIS — M5137 Other intervertebral disc degeneration, lumbosacral region: Secondary | ICD-10-CM | POA: Diagnosis not present

## 2019-05-20 DIAGNOSIS — M9905 Segmental and somatic dysfunction of pelvic region: Secondary | ICD-10-CM | POA: Diagnosis not present

## 2019-05-20 DIAGNOSIS — M5386 Other specified dorsopathies, lumbar region: Secondary | ICD-10-CM | POA: Diagnosis not present

## 2019-05-20 DIAGNOSIS — M62830 Muscle spasm of back: Secondary | ICD-10-CM | POA: Diagnosis not present

## 2019-05-20 DIAGNOSIS — M9903 Segmental and somatic dysfunction of lumbar region: Secondary | ICD-10-CM | POA: Diagnosis not present

## 2019-05-20 DIAGNOSIS — M9902 Segmental and somatic dysfunction of thoracic region: Secondary | ICD-10-CM | POA: Diagnosis not present

## 2019-05-23 DIAGNOSIS — Z20828 Contact with and (suspected) exposure to other viral communicable diseases: Secondary | ICD-10-CM | POA: Diagnosis not present

## 2019-05-23 DIAGNOSIS — M62830 Muscle spasm of back: Secondary | ICD-10-CM | POA: Diagnosis not present

## 2019-05-23 DIAGNOSIS — M5386 Other specified dorsopathies, lumbar region: Secondary | ICD-10-CM | POA: Diagnosis not present

## 2019-05-23 DIAGNOSIS — M9903 Segmental and somatic dysfunction of lumbar region: Secondary | ICD-10-CM | POA: Diagnosis not present

## 2019-05-23 DIAGNOSIS — M9902 Segmental and somatic dysfunction of thoracic region: Secondary | ICD-10-CM | POA: Diagnosis not present

## 2019-05-23 DIAGNOSIS — M9905 Segmental and somatic dysfunction of pelvic region: Secondary | ICD-10-CM | POA: Diagnosis not present

## 2019-05-23 DIAGNOSIS — M5137 Other intervertebral disc degeneration, lumbosacral region: Secondary | ICD-10-CM | POA: Diagnosis not present

## 2019-05-23 DIAGNOSIS — Z7189 Other specified counseling: Secondary | ICD-10-CM | POA: Diagnosis not present

## 2019-06-09 DIAGNOSIS — M9903 Segmental and somatic dysfunction of lumbar region: Secondary | ICD-10-CM | POA: Diagnosis not present

## 2019-06-09 DIAGNOSIS — M62830 Muscle spasm of back: Secondary | ICD-10-CM | POA: Diagnosis not present

## 2019-06-09 DIAGNOSIS — M5137 Other intervertebral disc degeneration, lumbosacral region: Secondary | ICD-10-CM | POA: Diagnosis not present

## 2019-06-09 DIAGNOSIS — M9905 Segmental and somatic dysfunction of pelvic region: Secondary | ICD-10-CM | POA: Diagnosis not present

## 2019-06-09 DIAGNOSIS — M9902 Segmental and somatic dysfunction of thoracic region: Secondary | ICD-10-CM | POA: Diagnosis not present

## 2019-06-09 DIAGNOSIS — M5386 Other specified dorsopathies, lumbar region: Secondary | ICD-10-CM | POA: Diagnosis not present

## 2019-06-23 ENCOUNTER — Other Ambulatory Visit: Payer: Self-pay

## 2019-06-23 DIAGNOSIS — Z20822 Contact with and (suspected) exposure to covid-19: Secondary | ICD-10-CM

## 2019-06-25 LAB — NOVEL CORONAVIRUS, NAA: SARS-CoV-2, NAA: DETECTED — AB

## 2019-07-16 DIAGNOSIS — M9905 Segmental and somatic dysfunction of pelvic region: Secondary | ICD-10-CM | POA: Diagnosis not present

## 2019-07-16 DIAGNOSIS — M9902 Segmental and somatic dysfunction of thoracic region: Secondary | ICD-10-CM | POA: Diagnosis not present

## 2019-07-16 DIAGNOSIS — M5137 Other intervertebral disc degeneration, lumbosacral region: Secondary | ICD-10-CM | POA: Diagnosis not present

## 2019-07-16 DIAGNOSIS — M62830 Muscle spasm of back: Secondary | ICD-10-CM | POA: Diagnosis not present

## 2019-07-16 DIAGNOSIS — M5386 Other specified dorsopathies, lumbar region: Secondary | ICD-10-CM | POA: Diagnosis not present

## 2019-07-16 DIAGNOSIS — M9903 Segmental and somatic dysfunction of lumbar region: Secondary | ICD-10-CM | POA: Diagnosis not present

## 2019-08-01 DIAGNOSIS — Z13220 Encounter for screening for lipoid disorders: Secondary | ICD-10-CM | POA: Diagnosis not present

## 2019-08-01 DIAGNOSIS — C6290 Malignant neoplasm of unspecified testis, unspecified whether descended or undescended: Secondary | ICD-10-CM | POA: Diagnosis not present

## 2019-08-01 DIAGNOSIS — Z1389 Encounter for screening for other disorder: Secondary | ICD-10-CM | POA: Diagnosis not present

## 2019-08-01 DIAGNOSIS — Z23 Encounter for immunization: Secondary | ICD-10-CM | POA: Diagnosis not present

## 2019-08-01 DIAGNOSIS — Z0000 Encounter for general adult medical examination without abnormal findings: Secondary | ICD-10-CM | POA: Diagnosis not present

## 2019-08-01 DIAGNOSIS — R69 Illness, unspecified: Secondary | ICD-10-CM | POA: Diagnosis not present

## 2019-08-01 DIAGNOSIS — J309 Allergic rhinitis, unspecified: Secondary | ICD-10-CM | POA: Diagnosis not present

## 2019-08-07 DIAGNOSIS — D224 Melanocytic nevi of scalp and neck: Secondary | ICD-10-CM | POA: Diagnosis not present

## 2019-08-07 DIAGNOSIS — Z808 Family history of malignant neoplasm of other organs or systems: Secondary | ICD-10-CM | POA: Diagnosis not present

## 2019-08-07 DIAGNOSIS — D2221 Melanocytic nevi of right ear and external auricular canal: Secondary | ICD-10-CM | POA: Diagnosis not present

## 2019-08-07 DIAGNOSIS — D2261 Melanocytic nevi of right upper limb, including shoulder: Secondary | ICD-10-CM | POA: Diagnosis not present

## 2019-08-07 DIAGNOSIS — D2239 Melanocytic nevi of other parts of face: Secondary | ICD-10-CM | POA: Diagnosis not present

## 2019-08-07 DIAGNOSIS — D2272 Melanocytic nevi of left lower limb, including hip: Secondary | ICD-10-CM | POA: Diagnosis not present

## 2019-08-07 DIAGNOSIS — D485 Neoplasm of uncertain behavior of skin: Secondary | ICD-10-CM | POA: Diagnosis not present

## 2019-08-07 DIAGNOSIS — D225 Melanocytic nevi of trunk: Secondary | ICD-10-CM | POA: Diagnosis not present

## 2019-08-07 DIAGNOSIS — Z86018 Personal history of other benign neoplasm: Secondary | ICD-10-CM | POA: Diagnosis not present

## 2019-08-07 DIAGNOSIS — D2262 Melanocytic nevi of left upper limb, including shoulder: Secondary | ICD-10-CM | POA: Diagnosis not present

## 2019-09-17 DIAGNOSIS — M5386 Other specified dorsopathies, lumbar region: Secondary | ICD-10-CM | POA: Diagnosis not present

## 2019-09-17 DIAGNOSIS — M9902 Segmental and somatic dysfunction of thoracic region: Secondary | ICD-10-CM | POA: Diagnosis not present

## 2019-09-17 DIAGNOSIS — M9903 Segmental and somatic dysfunction of lumbar region: Secondary | ICD-10-CM | POA: Diagnosis not present

## 2019-09-17 DIAGNOSIS — M5137 Other intervertebral disc degeneration, lumbosacral region: Secondary | ICD-10-CM | POA: Diagnosis not present

## 2019-09-17 DIAGNOSIS — M9905 Segmental and somatic dysfunction of pelvic region: Secondary | ICD-10-CM | POA: Diagnosis not present

## 2019-09-17 DIAGNOSIS — M62830 Muscle spasm of back: Secondary | ICD-10-CM | POA: Diagnosis not present

## 2019-09-19 ENCOUNTER — Encounter: Payer: Self-pay | Admitting: Sports Medicine

## 2019-09-19 ENCOUNTER — Other Ambulatory Visit: Payer: Self-pay

## 2019-09-19 ENCOUNTER — Ambulatory Visit (INDEPENDENT_AMBULATORY_CARE_PROVIDER_SITE_OTHER): Payer: 59 | Admitting: Sports Medicine

## 2019-09-19 DIAGNOSIS — G8929 Other chronic pain: Secondary | ICD-10-CM

## 2019-09-19 DIAGNOSIS — M5136 Other intervertebral disc degeneration, lumbar region: Secondary | ICD-10-CM

## 2019-09-19 DIAGNOSIS — M25561 Pain in right knee: Secondary | ICD-10-CM | POA: Diagnosis not present

## 2019-09-19 DIAGNOSIS — M222X2 Patellofemoral disorders, left knee: Secondary | ICD-10-CM

## 2019-09-19 DIAGNOSIS — M51369 Other intervertebral disc degeneration, lumbar region without mention of lumbar back pain or lower extremity pain: Secondary | ICD-10-CM

## 2019-09-19 MED ORDER — MELOXICAM 15 MG PO TABS: ORAL_TABLET | ORAL | 3 refills | Status: DC

## 2019-09-19 NOTE — Assessment & Plan Note (Signed)
Genard returns, he is a pleasant 34 year old male, he has been having increasing low back pain, with radiation down both legs, left leg worse than right, x-rays from 2019 do show L4-L5 DDD. There is a history of testicular cancer. Because of failure of greater than 6 weeks of conservative treatment including chiropractic manipulation we are going to proceed with a lumbar spine MRI, formal physical therapy, meloxicam for 2 weeks and then as needed. Follow-up in 6 weeks, and if persistent discomfort we we will discuss lumbar epidural.

## 2019-09-19 NOTE — Progress Notes (Signed)
    Procedures performed today:    None.  Independent interpretation of notes and tests performed by another provider:   None.  Impression and Recommendations:    Lumbar degenerative disc disease Oc returns, he is a pleasant 34 year old male, he has been having increasing low back pain, with radiation down both legs, left leg worse than right, x-rays from 2019 do show L4-L5 DDD. There is a history of testicular cancer. Because of failure of greater than 6 weeks of conservative treatment including chiropractic manipulation we are going to proceed with a lumbar spine MRI, formal physical therapy, meloxicam for 2 weeks and then as needed. Follow-up in 6 weeks, and if persistent discomfort we we will discuss lumbar epidural.  Right knee pain Chronic anterolateral right knee pain. X-rays were unrevealing. Failed greater than 6 weeks conservative measures, proceeding with right knee MRI.    ___________________________________________ Gwen Her. Dianah Field, M.D., ABFM., CAQSM. Primary Care and [place] Instructor of [place] [employer] [facility] of Medicine

## 2019-09-19 NOTE — Assessment & Plan Note (Signed)
Chronic anterolateral right knee pain. X-rays were unrevealing. Failed greater than 6 weeks conservative measures, proceeding with right knee MRI.

## 2019-10-11 ENCOUNTER — Ambulatory Visit (INDEPENDENT_AMBULATORY_CARE_PROVIDER_SITE_OTHER): Payer: 59

## 2019-10-11 ENCOUNTER — Other Ambulatory Visit: Payer: Self-pay

## 2019-10-11 DIAGNOSIS — M5136 Other intervertebral disc degeneration, lumbar region: Secondary | ICD-10-CM | POA: Diagnosis not present

## 2019-10-11 DIAGNOSIS — G8929 Other chronic pain: Secondary | ICD-10-CM

## 2019-10-11 DIAGNOSIS — M48061 Spinal stenosis, lumbar region without neurogenic claudication: Secondary | ICD-10-CM | POA: Diagnosis not present

## 2019-10-11 DIAGNOSIS — M1711 Unilateral primary osteoarthritis, right knee: Secondary | ICD-10-CM | POA: Diagnosis not present

## 2019-10-11 DIAGNOSIS — M25561 Pain in right knee: Secondary | ICD-10-CM

## 2019-10-11 DIAGNOSIS — M5126 Other intervertebral disc displacement, lumbar region: Secondary | ICD-10-CM | POA: Diagnosis not present

## 2019-10-11 IMAGING — MR MR LUMBAR SPINE W/O CM
4 of 5 series · 26 of 48 positions shown · non-contrast
Comparison: Lumbar radiographs from [4O]

CLINICAL DATA: Lumbar radiculopathy. Low back and bilateral hip
pain and posterior upper leg pain for 4 years.

EXAM:
MRI LUMBAR SPINE WITHOUT CONTRAST
TECHNIQUE: Multiplanar, multisequence MR imaging of the lumbar spine was
performed. No intravenous contrast was administered.

[Series 2: T2 · sagittal · 4.0mm · 0.81mm/px · 5 of 15 slices shown (1 of 2)]
[im 1/15]
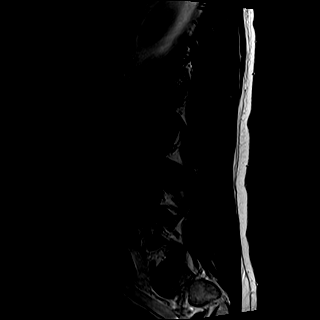
[im 4/15]
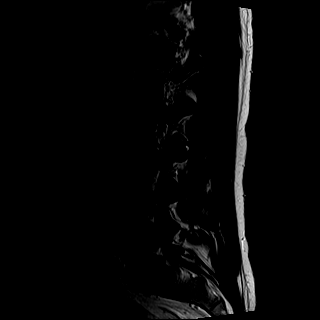
[im 8/15]
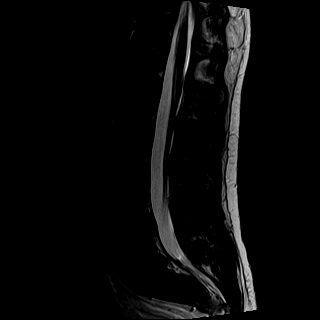
[im 11/15]
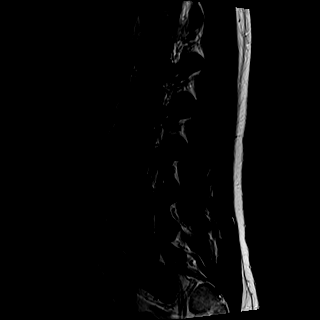
[im 15/15]
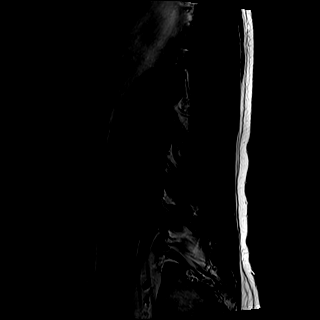

[Series 3: T1 · sagittal · 4.0mm · 0.41mm/px · 5 of 15 slices shown (1 of 2)]
[im 1/15]
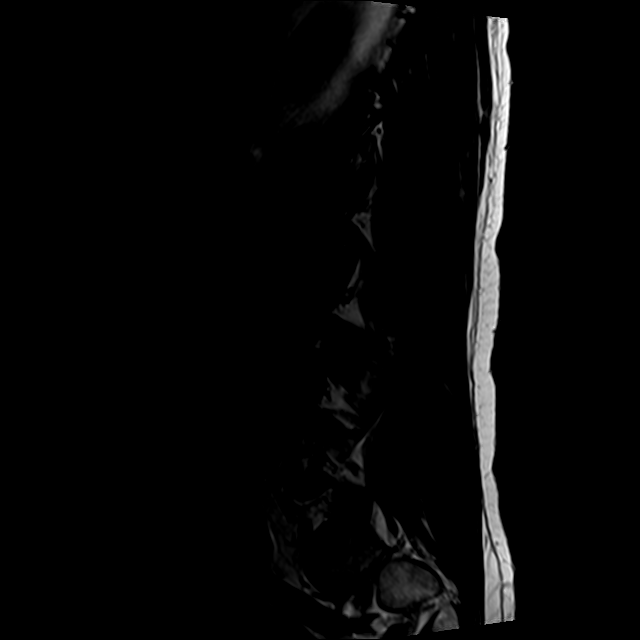
[im 4/15]
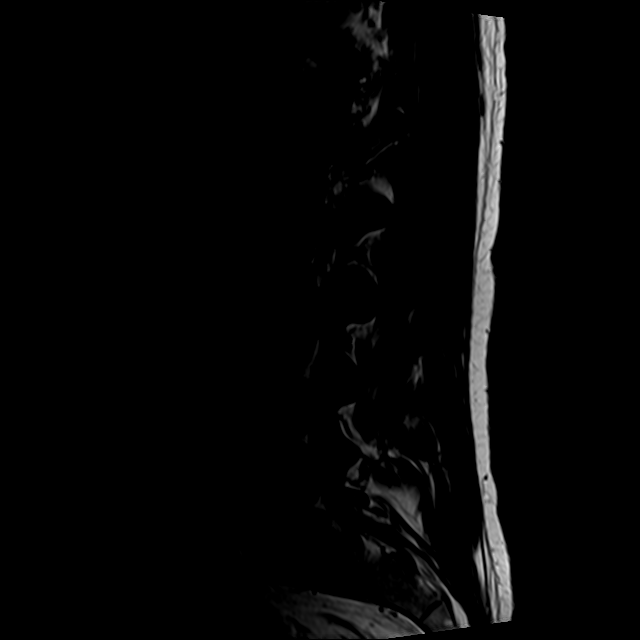
[im 8/15]
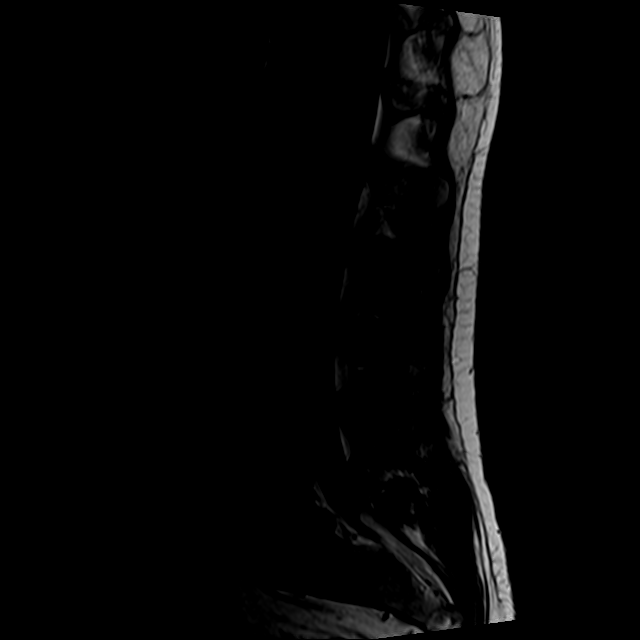
[im 11/15]
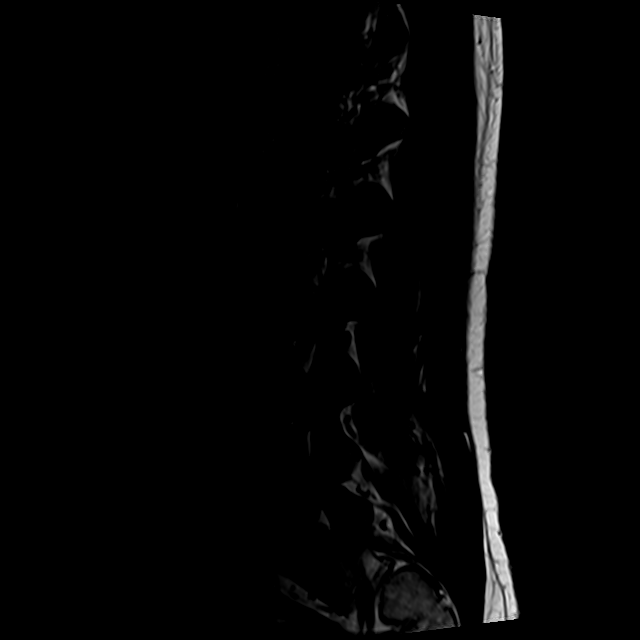
[im 15/15]
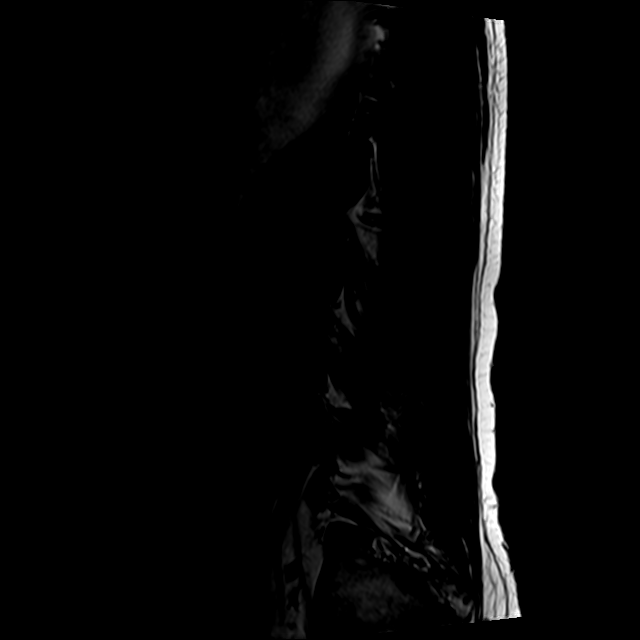

[Series 5: T2 · axial · 4.0mm · 0.78mm/px · z∈[-119,+94]mm · 10 of 42 slices shown (2 of 2)]
[im 3/42]
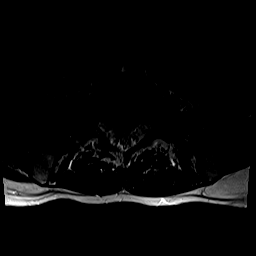
[im 6/42]
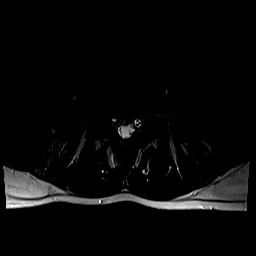
[im 9/42]
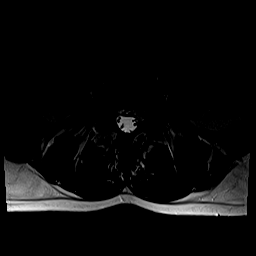
[im 14/42]
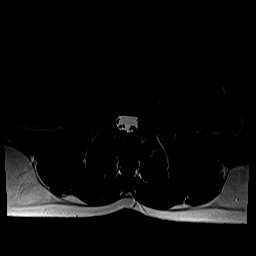
[im 20/42]
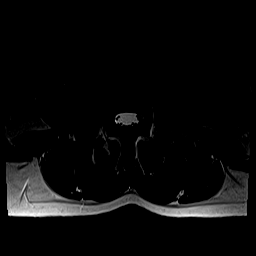
[im 22/42]
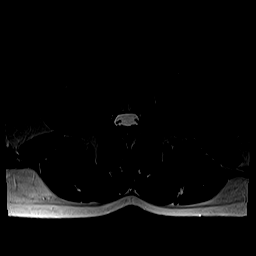
[im 25/42]
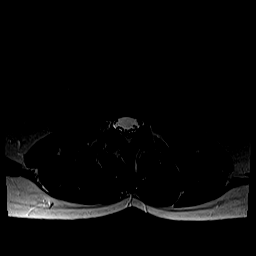
[im 31/42]
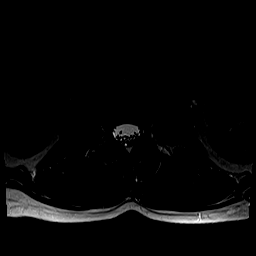
[im 36/42]
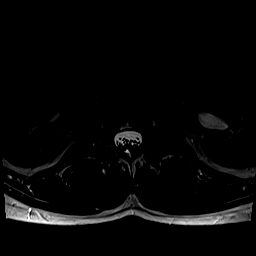
[im 42/42]
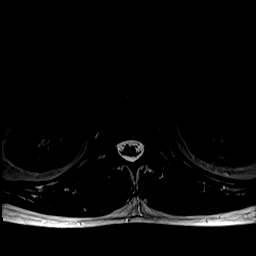

[Series 6: T1 · axial · 4.0mm · 0.39mm/px · z∈[-119,+64]mm · 6 of 42 slices shown (2 of 2)]
[im 3/42]
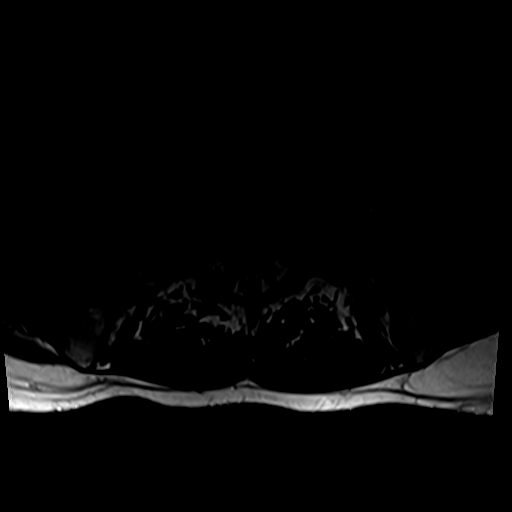
[im 6/42]
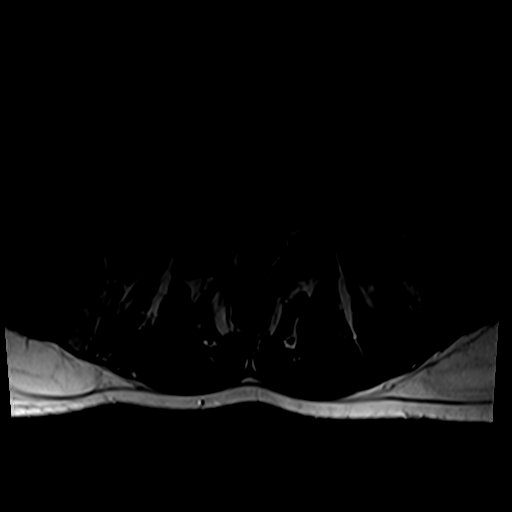
[im 9/42]
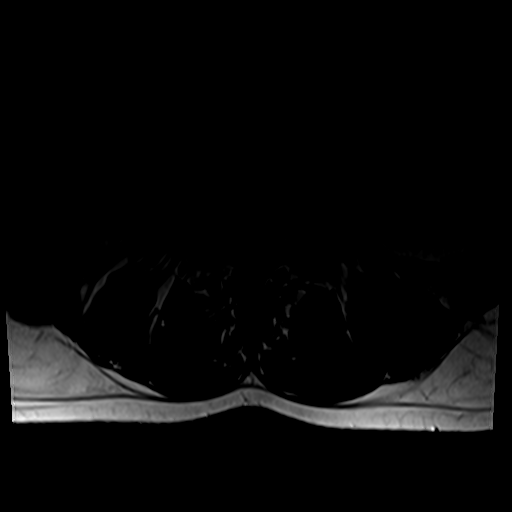
[im 14/42]
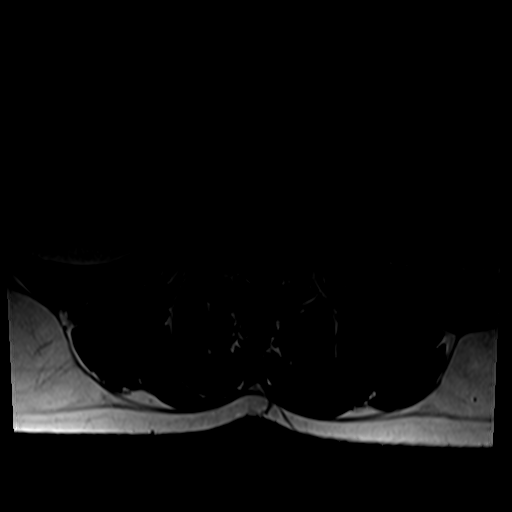
[im 22/42]
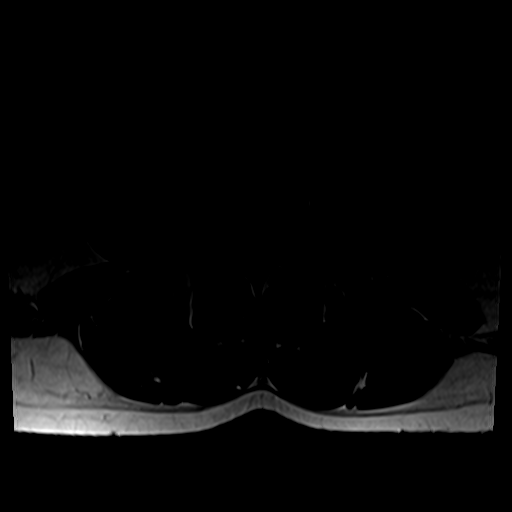
[im 36/42]
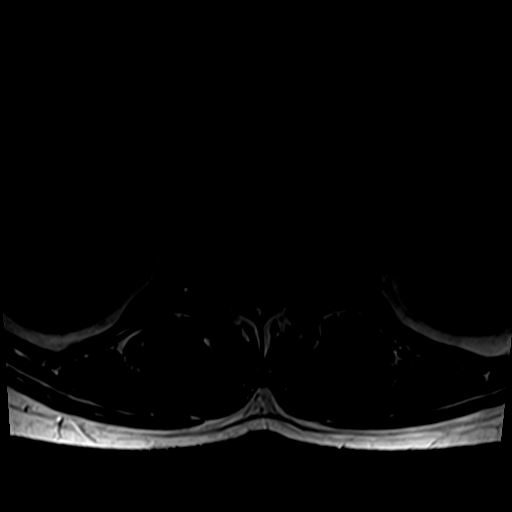

[26 of 48 positions shown; findings below may reference images not displayed]

FINDINGS: Segmentation: There are five lumbar type vertebral bodies. The last
full intervertebral disc space is labeled L5-S1. This correlates
with the lumbar radiographs.

Alignment: Normal. The facets are normally aligned. No pars defects.

Vertebrae:  Normal marrow signal. No bone lesions or fractures.

Conus medullaris and cauda equina: Conus extends to the L1 level.
Conus and cauda equina appear normal.

Paraspinal and other soft tissues: No significant findings.

Disc levels:

T12-L1: No significant findings.

L1-2: No significant findings.

L2-3: No significant findings.

L3-4: No significant findings.

L4-5: Disc desiccation and degeneration with mild disc space
narrowing. There is a bulging annulus and shallow disc protrusion
with mild flattening of the ventral thecal sac. Mild lateral recess
encroachment but no significant spinal or foraminal stenosis.

L5-S1: Disc desiccation and degeneration with disc space narrowing
and a bulging annulus and shallow central disc protrusion. Spinal
canal is fairly generous in is no significant spinal or foraminal
stenosis.
IMPRESSION: 1. Disc desiccation and degeneration at L4-5 and L5-S1 with bulging
discs and shallow disc protrusions.
2. The other intervertebral disc spaces are unremarkable.

## 2019-10-11 IMAGING — MR MR KNEE*R* W/O CM
7 series · 40 of 40 positions shown · non-contrast
Comparison: None.

CLINICAL DATA: Chronic lateral knee pain and instability.

EXAM:
MRI OF THE RIGHT KNEE WITHOUT CONTRAST
TECHNIQUE: Multiplanar, multisequence MR imaging of the knee was performed. No
intravenous contrast was administered.

[Series 3: T2 fat-sat · axial · 4.0mm · 0.53mm/px · z∈[-96,+74]mm · 8 of 35 slices shown (1 of 3)]
[im 1/35]
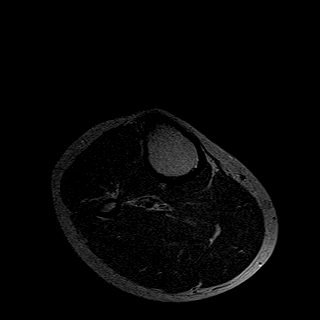
[im 5/35]
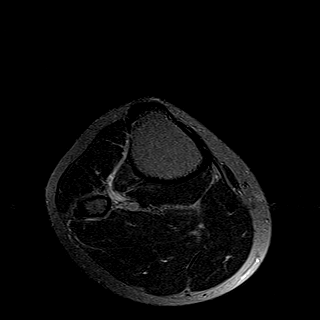
[im 10/35]
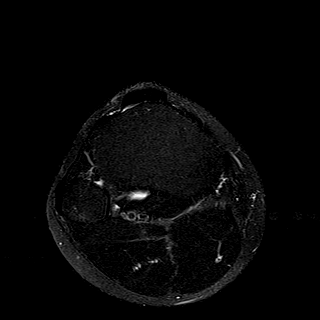
[im 15/35]
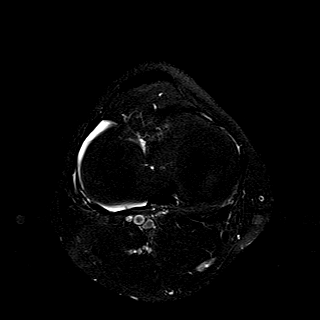
[im 20/35]
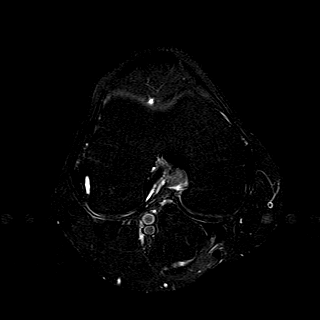
[im 25/35]
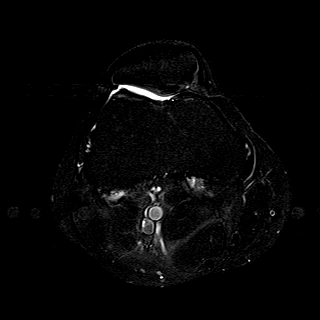
[im 30/35]
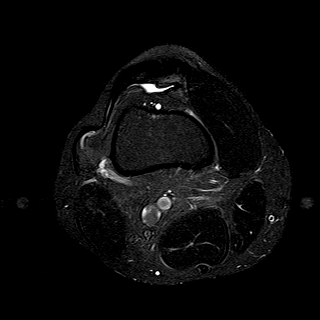
[im 35/35]
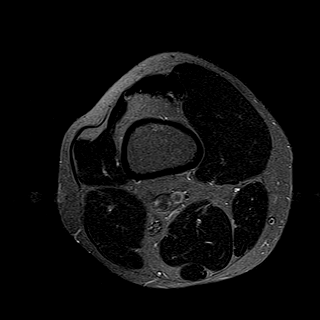

[Series 4: T1 · coronal · 4.0mm · 0.62mm/px · 6 of 28 slices shown]
[im 1/28]
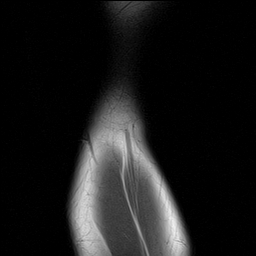
[im 6/28]
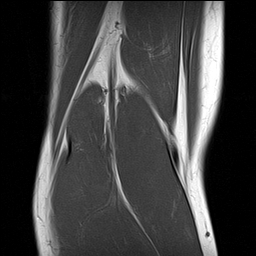
[im 11/28]
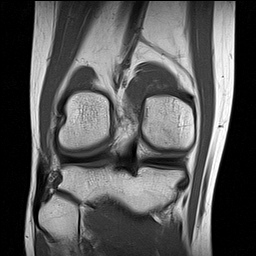
[im 17/28]
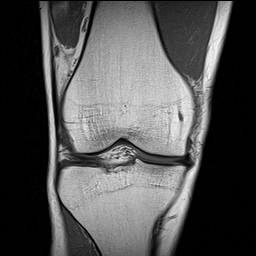
[im 22/28]
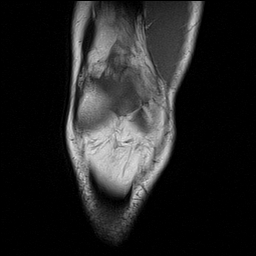
[im 28/28]
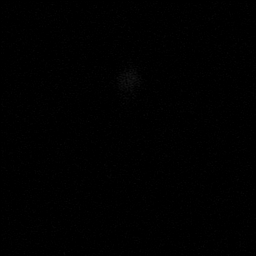

[Series 7: PD fat-sat · sagittal · 3.0mm · 0.62mm/px · 6 of 33 slices shown (1 of 3)]
[im 1/33]
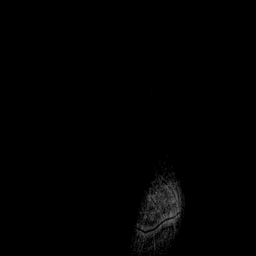
[im 7/33]
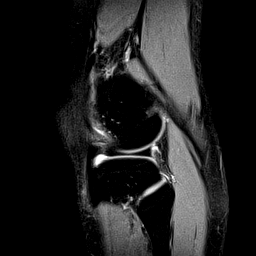
[im 13/33]
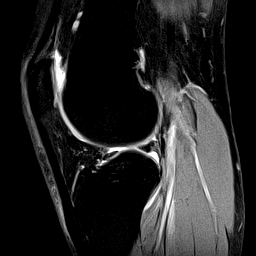
[im 20/33]
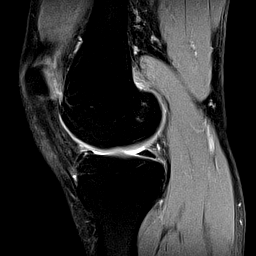
[im 26/33]
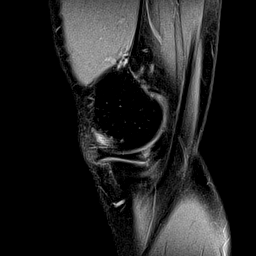
[im 33/33]
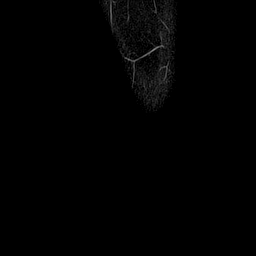

[Series 8: T2 fat-sat · sagittal · 3.0mm · 0.62mm/px · 6 of 33 slices shown (2 of 3)]
[im 1/33]
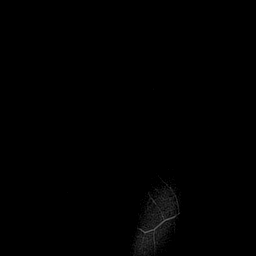
[im 7/33]
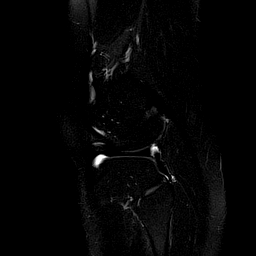
[im 13/33]
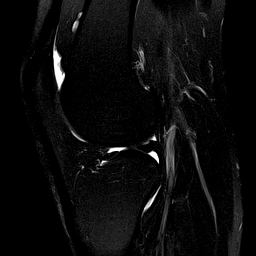
[im 20/33]
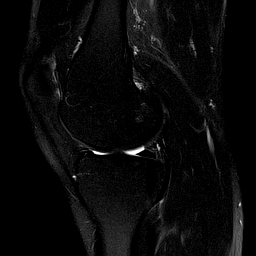
[im 26/33]
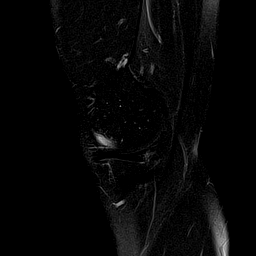
[im 33/33]
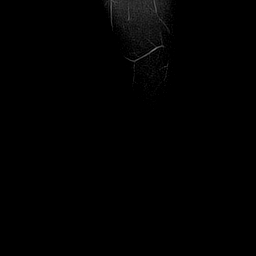

[Series 9: T2 fat-sat · coronal · 4.0mm · 0.50mm/px · 5 of 28 slices shown (3 of 3)]
[im 1/28]
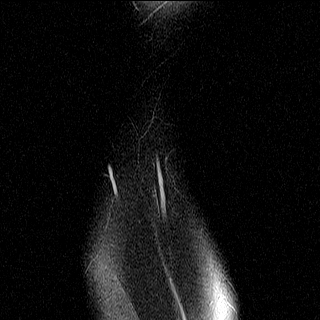
[im 7/28]
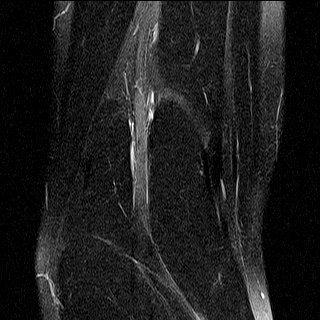
[im 14/28]
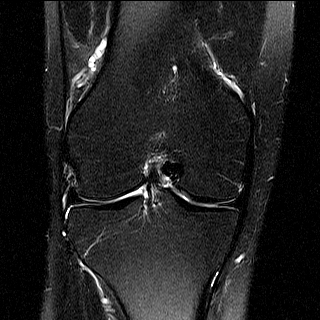
[im 21/28]
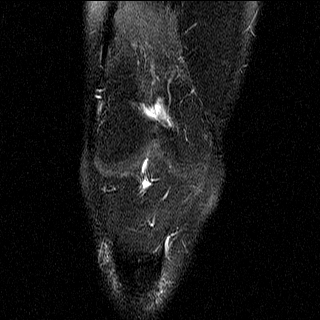
[im 28/28]
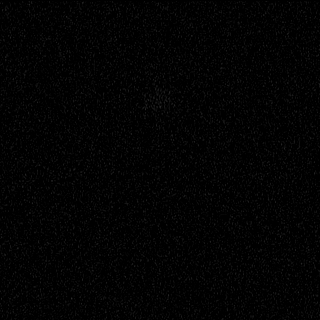

[Series 10: PD fat-sat · coronal · 4.0mm · 0.62mm/px · 5 of 28 slices shown (2 of 3)]
[im 1/28]
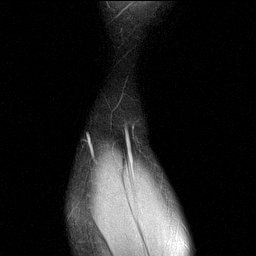
[im 7/28]
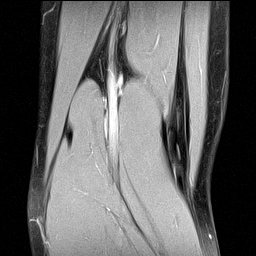
[im 14/28]
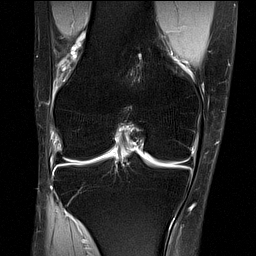
[im 21/28]
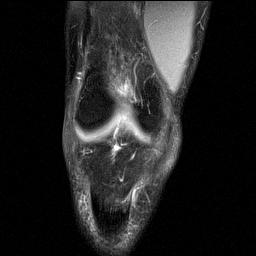
[im 28/28]
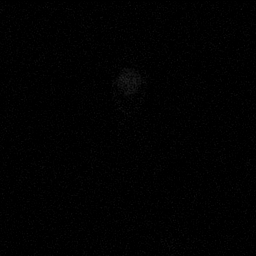

[Series 11: PD fat-sat · coronal · 2.0mm · 0.62mm/px · 4 of 19 slices shown (3 of 3)]
[im 1/19]
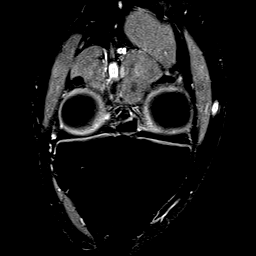
[im 7/19]
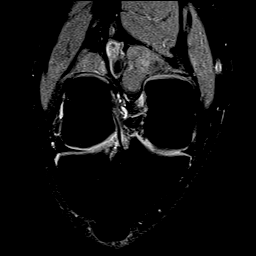
[im 13/19]
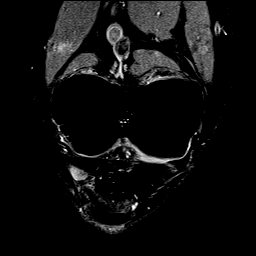
[im 19/19]
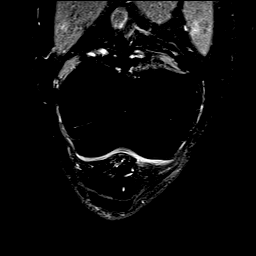

[40 of 40 positions shown; findings below may reference images not displayed]

FINDINGS: Exam is somewhat limited by patient motion.

MENISCI

Medial meniscus:  Intact

Lateral meniscus:  Intact

LIGAMENTS

Cruciates:  Intact

Collaterals:  Intact

CARTILAGE

Patellofemoral:  No significant degenerative changes.

Medial:  Mild degenerative chondrosis.

Lateral:  Normal

Joint:  Small amount of joint fluid but no overt joint effusion.

Popliteal Fossa:  No popliteal mass or Baker's cyst.

Extensor Mechanism: The patella retinacular structures are intact
and the quadriceps and patellar tendons are intact.

Bones:  No acute bony findings.

Other: Normal knee musculature.
IMPRESSION: 1. Intact ligamentous structures and no acute bony findings.
2. No meniscal tears.
3. Mild medial compartment degenerative chondrosis.
4. No joint effusion or Baker's cyst.

## 2020-01-15 DIAGNOSIS — M62830 Muscle spasm of back: Secondary | ICD-10-CM | POA: Diagnosis not present

## 2020-01-15 DIAGNOSIS — M5386 Other specified dorsopathies, lumbar region: Secondary | ICD-10-CM | POA: Diagnosis not present

## 2020-01-15 DIAGNOSIS — M9902 Segmental and somatic dysfunction of thoracic region: Secondary | ICD-10-CM | POA: Diagnosis not present

## 2020-01-15 DIAGNOSIS — M9903 Segmental and somatic dysfunction of lumbar region: Secondary | ICD-10-CM | POA: Diagnosis not present

## 2020-01-15 DIAGNOSIS — M5137 Other intervertebral disc degeneration, lumbosacral region: Secondary | ICD-10-CM | POA: Diagnosis not present

## 2020-01-15 DIAGNOSIS — M9905 Segmental and somatic dysfunction of pelvic region: Secondary | ICD-10-CM | POA: Diagnosis not present

## 2020-02-20 DIAGNOSIS — M9905 Segmental and somatic dysfunction of pelvic region: Secondary | ICD-10-CM | POA: Diagnosis not present

## 2020-02-20 DIAGNOSIS — M5386 Other specified dorsopathies, lumbar region: Secondary | ICD-10-CM | POA: Diagnosis not present

## 2020-02-20 DIAGNOSIS — M5137 Other intervertebral disc degeneration, lumbosacral region: Secondary | ICD-10-CM | POA: Diagnosis not present

## 2020-02-20 DIAGNOSIS — M9903 Segmental and somatic dysfunction of lumbar region: Secondary | ICD-10-CM | POA: Diagnosis not present

## 2020-02-20 DIAGNOSIS — M62830 Muscle spasm of back: Secondary | ICD-10-CM | POA: Diagnosis not present

## 2020-02-20 DIAGNOSIS — M9902 Segmental and somatic dysfunction of thoracic region: Secondary | ICD-10-CM | POA: Diagnosis not present

## 2020-03-04 ENCOUNTER — Other Ambulatory Visit: Payer: Self-pay | Admitting: *Deleted

## 2020-03-04 DIAGNOSIS — M5136 Other intervertebral disc degeneration, lumbar region: Secondary | ICD-10-CM

## 2020-03-04 DIAGNOSIS — M222X2 Patellofemoral disorders, left knee: Secondary | ICD-10-CM

## 2020-03-04 NOTE — Telephone Encounter (Signed)
Pt left vm stating that he tweaked his back working out yesterday and wanted to know if you'd refill these two meds for him.

## 2020-03-05 MED ORDER — MELOXICAM 15 MG PO TABS: ORAL_TABLET | ORAL | 3 refills | Status: DC

## 2020-03-05 MED ORDER — CYCLOBENZAPRINE HCL 10 MG PO TABS: ORAL_TABLET | ORAL | 3 refills | Status: DC

## 2020-03-22 DIAGNOSIS — M5137 Other intervertebral disc degeneration, lumbosacral region: Secondary | ICD-10-CM | POA: Diagnosis not present

## 2020-03-22 DIAGNOSIS — M9903 Segmental and somatic dysfunction of lumbar region: Secondary | ICD-10-CM | POA: Diagnosis not present

## 2020-03-22 DIAGNOSIS — M62830 Muscle spasm of back: Secondary | ICD-10-CM | POA: Diagnosis not present

## 2020-03-22 DIAGNOSIS — M9905 Segmental and somatic dysfunction of pelvic region: Secondary | ICD-10-CM | POA: Diagnosis not present

## 2020-03-22 DIAGNOSIS — M9902 Segmental and somatic dysfunction of thoracic region: Secondary | ICD-10-CM | POA: Diagnosis not present

## 2020-03-22 DIAGNOSIS — M5386 Other specified dorsopathies, lumbar region: Secondary | ICD-10-CM | POA: Diagnosis not present

## 2020-04-22 DIAGNOSIS — M62830 Muscle spasm of back: Secondary | ICD-10-CM | POA: Diagnosis not present

## 2020-04-22 DIAGNOSIS — M9905 Segmental and somatic dysfunction of pelvic region: Secondary | ICD-10-CM | POA: Diagnosis not present

## 2020-04-22 DIAGNOSIS — Z23 Encounter for immunization: Secondary | ICD-10-CM | POA: Diagnosis not present

## 2020-04-22 DIAGNOSIS — M5137 Other intervertebral disc degeneration, lumbosacral region: Secondary | ICD-10-CM | POA: Diagnosis not present

## 2020-04-22 DIAGNOSIS — M9903 Segmental and somatic dysfunction of lumbar region: Secondary | ICD-10-CM | POA: Diagnosis not present

## 2020-04-22 DIAGNOSIS — M9902 Segmental and somatic dysfunction of thoracic region: Secondary | ICD-10-CM | POA: Diagnosis not present

## 2020-04-22 DIAGNOSIS — M5386 Other specified dorsopathies, lumbar region: Secondary | ICD-10-CM | POA: Diagnosis not present

## 2020-05-12 DIAGNOSIS — S76319A Strain of muscle, fascia and tendon of the posterior muscle group at thigh level, unspecified thigh, initial encounter: Secondary | ICD-10-CM | POA: Diagnosis not present

## 2020-05-12 DIAGNOSIS — M25551 Pain in right hip: Secondary | ICD-10-CM | POA: Diagnosis not present

## 2020-05-19 ENCOUNTER — Encounter: Payer: Self-pay | Admitting: Orthopaedic Surgery

## 2020-05-19 ENCOUNTER — Ambulatory Visit (INDEPENDENT_AMBULATORY_CARE_PROVIDER_SITE_OTHER): Payer: 59 | Admitting: Orthopaedic Surgery

## 2020-05-19 DIAGNOSIS — M25561 Pain in right knee: Secondary | ICD-10-CM | POA: Diagnosis not present

## 2020-05-19 DIAGNOSIS — M25551 Pain in right hip: Secondary | ICD-10-CM | POA: Diagnosis not present

## 2020-05-19 DIAGNOSIS — G8929 Other chronic pain: Secondary | ICD-10-CM | POA: Diagnosis not present

## 2020-05-19 NOTE — Progress Notes (Signed)
Office Visit Note   Patient: Christopher Swanson           Date of Birth: [birth year]           MRN: [id number] Visit Date: 05/19/2020              Requested by: Wenda Low, MD [place] [employer] [address] [place],  [place] [address] PCP: Wenda Low, MD   Assessment & Plan: Visit Diagnoses:  1. Pain in right hip   2. Chronic pain of right knee     Plan: Given the acute nature of his back pain I am going to put him on a 6-day steroid taper to see if this will help calm down some of his inflammation.  At this point we will also obtain a MRI of the right hip and pelvis area to assess the proximal hamstring at the attachment of the ischium as well as the right hip joint itself.  He agrees with this treatment plan.  We will see him back after the MRI.  Follow-Up Instructions: Return in about 2 weeks (around 06/02/2020).   Orders:  No orders of the defined types were placed in this encounter.  No orders of the defined types were placed in this encounter.     Procedures: No procedures performed   Clinical Data: No additional findings.   Subjective: Chief Complaint  Patient presents with  . Right Leg - Pain  The patient is a very pleasant and active 34 year old gentleman who has a history of chronic pain as relates to an old hamstring injury when he tore his hamstring at age 43.  This was a proximal hamstring injury.  He then started develop pain about 5 to 6 years ago.  He has had low back pain and this flared up this past weekend after lifting a heavy dog.  He says when he works out his right leg feels weak.  He actually has had an MRI of his lumbar spine earlier this year and an MRI of his right knee.  These are on the canopy system review.  These were read as really no significant findings.  He denies any numbness and tingling in his right lower extremity but he does feel that it bothers him on a daily basis and certainly hurts when he works out.  There is no locking catching  with the knee but the knee does click and pop.  There is no specific groin pain.  He has tried anti-inflammatories and muscle relaxants as well as pain meds but nothing really helps much and he does not want to be on any type of medications.  He is not a diabetic and not a smoker.  HPI  Review of Systems He currently denies any headache, chest pain, shortness of breath, fever, chills, nausea, vomiting  Objective: Vital Signs: There were no vitals taken for this visit.  Physical Exam He is alert and orient x3 and in no acute distress Ortho Exam Examination of his right lower extremity does show some some deficit on inspection of the proximal hamstring area.  The remainder of his lower extremity exam however is normal other than some clicking at the patellofemoral joint.  He has 5 out of 5 strength of all muscle groups of the right lower extremity.  There is a healed scar near the level of the fibular head on the right knee.  He does point to the lateral area of the knee at the IT band and the fibular  head is more the source of his pain.  It is asymptomatic today when I stressed this area at the joint of the fibular head to the tibia.  He has negative McMurray's and negative Lachman's exam and no knee joint effusion.  His right hip moves smoothly. Specialty Comments:  No specialty comments available.  Imaging: No results found. Previous x-rays of his pelvis and right hip were unremarkable.  The MRI of his right knee and the MRI of his lumbar spine showed no significant findings that would explain his pain.  PMFS History: Patient Active Problem List   Diagnosis Date Noted  . Right knee pain 09/19/2019  . Patellofemoral stress syndrome of left knee 07/29/2015  . Lumbar degenerative disc disease 02/16/2015  . Cancer of testis, seminoma postorchiectomy 12/16/2012  . ADD (attention deficit disorder) 07/31/2012   Past Medical History:  Diagnosis Date  . ADD (attention deficit disorder)   .  Testicular tumor    left    History reviewed. No pertinent family history.  Past Surgical History:  Procedure Laterality Date  . CIRCUMCISION  AGE 75  . ORCHIECTOMY Left 12/16/2012   Procedure: ORCHIECTOMY;  Surgeon: Bernestine Amass, MD;  Location: [facility];  Service: Urology;  Laterality: Left;  . TESTICULAR PROSTHETIC INSERTION Left 12/16/2012   Procedure: INSERTION TESTICULAR PROSTHESIS;  Surgeon: Bernestine Amass, MD;  Location[facility];  Service: Urology;  Laterality: Left;  . WISDOM TOOTH EXTRACTION  AGE 39   Social History   Occupational History  . Not on file  Tobacco Use  . Smoking status: Never Smoker  . Smokeless tobacco: Never Used  Substance and Sexual Activity  . Alcohol use: Yes    Alcohol/week: 5.0 standard drinks    Types: 3 Cans of beer, 2 Glasses of wine per week    Comment: WEEKENDS  . Drug use: No  . Sexual activity: Not on file

## 2020-05-25 ENCOUNTER — Other Ambulatory Visit: Payer: Self-pay

## 2020-05-25 DIAGNOSIS — M25551 Pain in right hip: Secondary | ICD-10-CM

## 2020-05-28 DIAGNOSIS — M9905 Segmental and somatic dysfunction of pelvic region: Secondary | ICD-10-CM | POA: Diagnosis not present

## 2020-05-28 DIAGNOSIS — M5137 Other intervertebral disc degeneration, lumbosacral region: Secondary | ICD-10-CM | POA: Diagnosis not present

## 2020-05-28 DIAGNOSIS — M9902 Segmental and somatic dysfunction of thoracic region: Secondary | ICD-10-CM | POA: Diagnosis not present

## 2020-05-28 DIAGNOSIS — M9903 Segmental and somatic dysfunction of lumbar region: Secondary | ICD-10-CM | POA: Diagnosis not present

## 2020-05-28 DIAGNOSIS — M5386 Other specified dorsopathies, lumbar region: Secondary | ICD-10-CM | POA: Diagnosis not present

## 2020-05-28 DIAGNOSIS — M62830 Muscle spasm of back: Secondary | ICD-10-CM | POA: Diagnosis not present

## 2020-05-31 ENCOUNTER — Telehealth: Payer: Self-pay | Admitting: Orthopaedic Surgery

## 2020-05-31 MED ORDER — METHYLPREDNISOLONE 4 MG PO TABS: ORAL_TABLET | ORAL | 0 refills | Status: DC

## 2020-05-31 NOTE — Telephone Encounter (Signed)
Patient called stating Dr. Ninfa Linden was suppose to send me a steroid but patient unsure what steroid is for. Please call patient and send medication to pharmacy on file. Patient phone number is [phone].

## 2020-05-31 NOTE — Telephone Encounter (Signed)
lvm informing pt

## 2020-05-31 NOTE — Telephone Encounter (Signed)
I just sent this in.  I was going to put him on a steroid taper just to see if that would calm down any inflammation that is going on in his back or hip or leg.

## 2020-06-02 ENCOUNTER — Ambulatory Visit: Payer: 59 | Admitting: Orthopaedic Surgery

## 2020-06-19 ENCOUNTER — Ambulatory Visit
Admission: RE | Admit: 2020-06-19 | Discharge: 2020-06-19 | Disposition: A | Discharge status: Home / Self Care | Payer: 59 | Source: Ambulatory Visit | Attending: Orthopaedic Surgery | Admitting: Orthopaedic Surgery

## 2020-06-19 ENCOUNTER — Other Ambulatory Visit: Payer: Self-pay

## 2020-06-19 DIAGNOSIS — M25551 Pain in right hip: Secondary | ICD-10-CM

## 2020-06-19 DIAGNOSIS — M5136 Other intervertebral disc degeneration, lumbar region: Secondary | ICD-10-CM | POA: Diagnosis not present

## 2020-06-19 DIAGNOSIS — R102 Pelvic and perineal pain: Secondary | ICD-10-CM | POA: Diagnosis not present

## 2020-06-19 IMAGING — MR MR PELVIS W/O CM
5 series · 35 of 48 positions shown · non-contrast
Comparison: None.

CLINICAL DATA: Pelvic pain. Right hip pain extending into the knee
hamstrings ankle.

EXAM:
MRI PELVIS WITHOUT CONTRAST
TECHNIQUE: Multiplanar multisequence MR imaging of the pelvis was performed. No
intravenous contrast was administered.

[Series 3: T1 · axial · 4.0mm · 0.70mm/px · z∈[-36,+199]mm · 8 of 48 slices shown (1 of 2)]
[im 1/48]
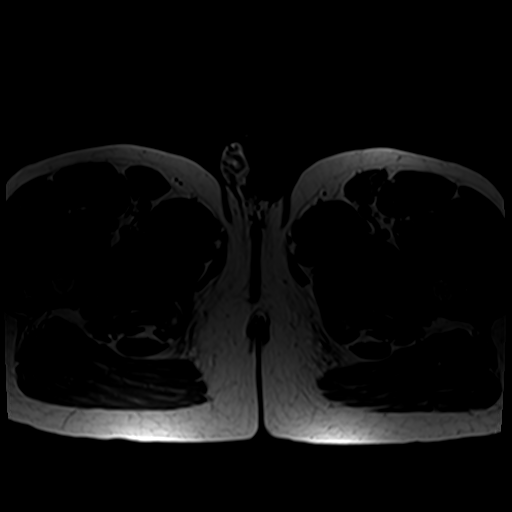
[im 6/48]
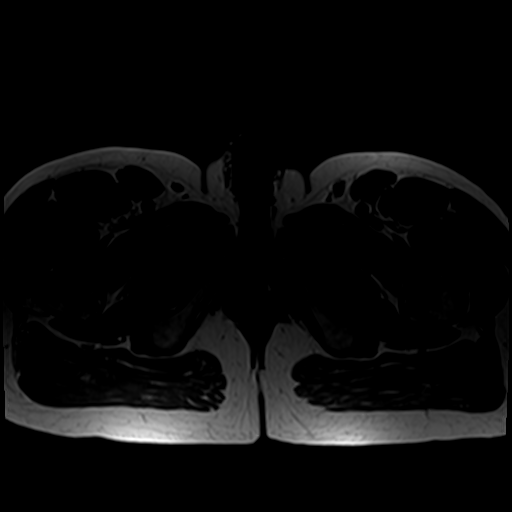
[im 16/48]
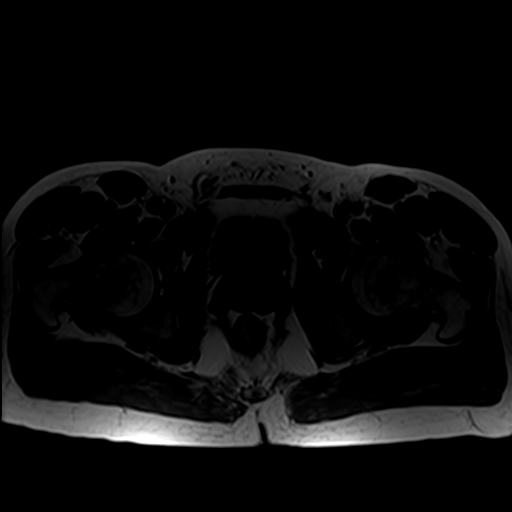
[im 21/48]
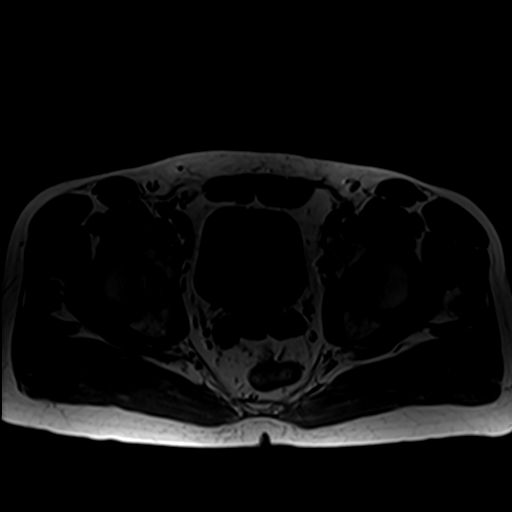
[im 27/48]
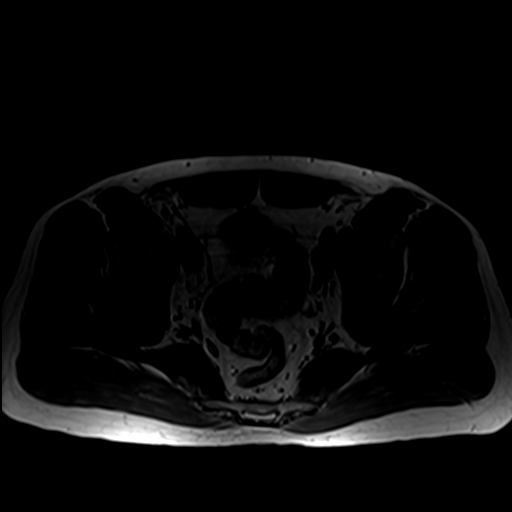
[im 32/48]
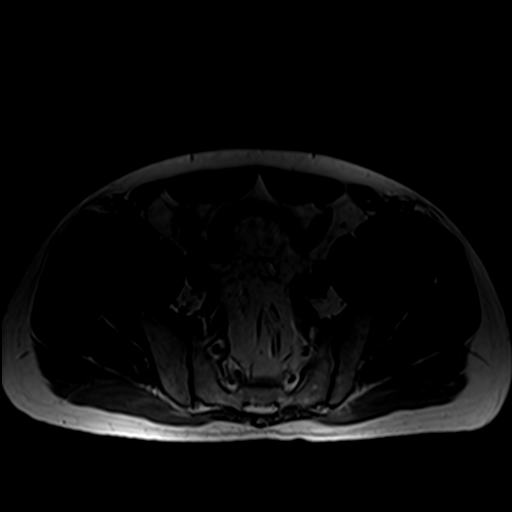
[im 42/48]
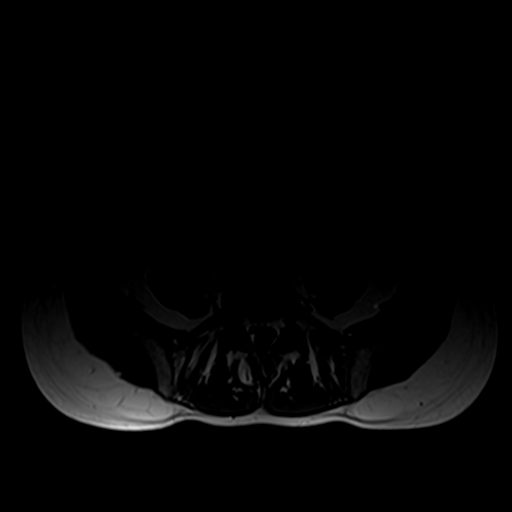
[im 48/48]
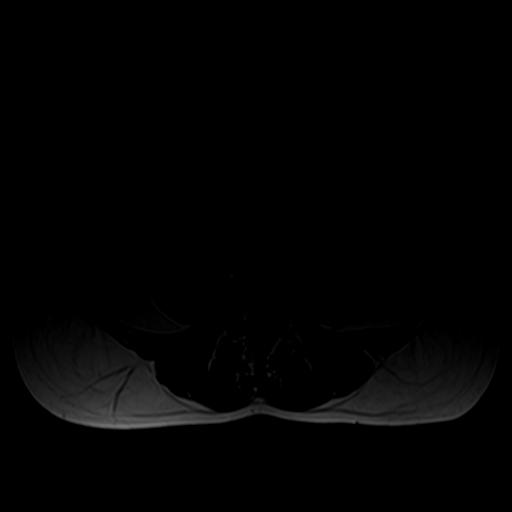

[Series 4: T2 fat-sat · axial · 4.0mm · 1.41mm/px · z∈[-33,+202]mm · 8 of 48 slices shown (1 of 2)]
[im 1/48]
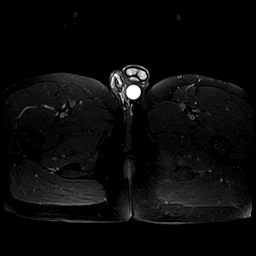
[im 6/48]
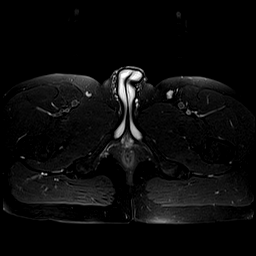
[im 16/48]
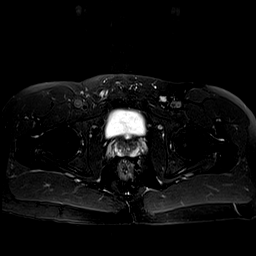
[im 21/48]
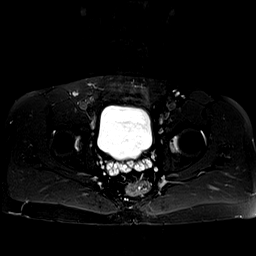
[im 27/48]
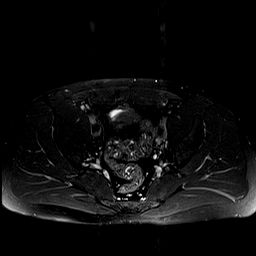
[im 32/48]
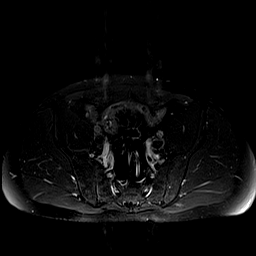
[im 42/48]
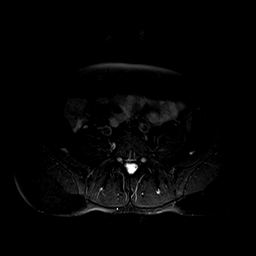
[im 48/48]
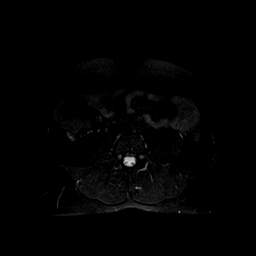

[Series 5: T1 · coronal · 4.0mm · 1.56mm/px · 7 of 36 slices shown (2 of 2)]
[im 1/36]
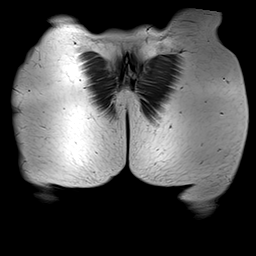
[im 6/36]
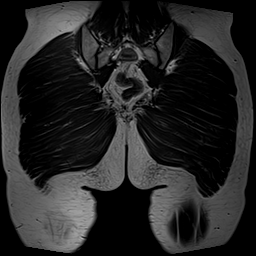
[im 12/36]
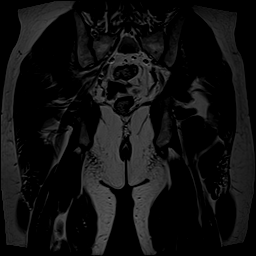
[im 18/36]
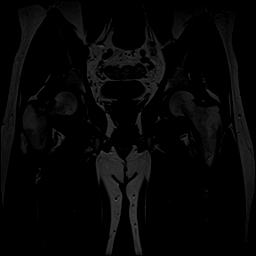
[im 24/36]
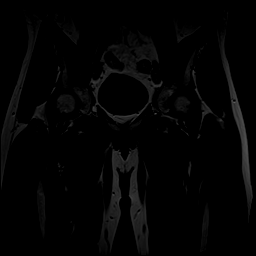
[im 30/36]
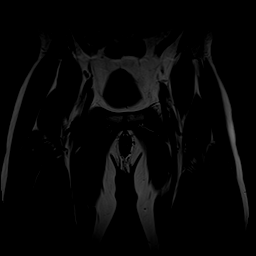
[im 36/36]
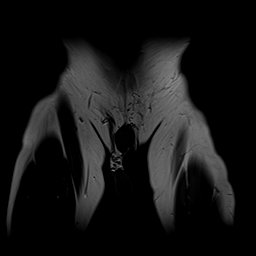

[Series 6: STIR · coronal · 4.0mm · 1.56mm/px · 7 of 36 slices shown]
[im 1/36]
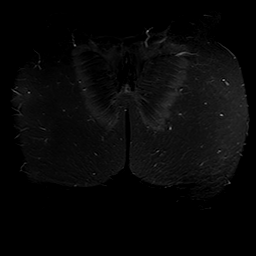
[im 6/36]
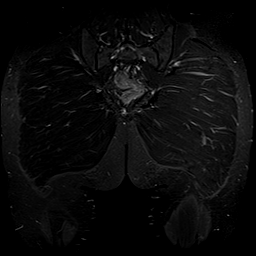
[im 12/36]
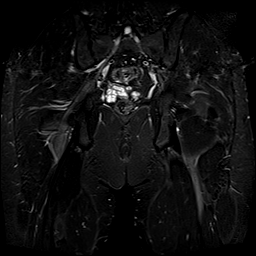
[im 18/36]
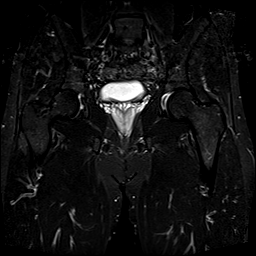
[im 24/36]
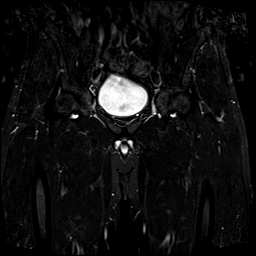
[im 30/36]
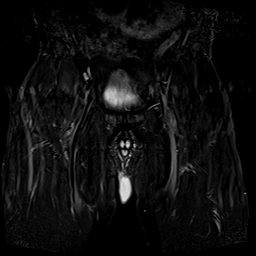
[im 36/36]
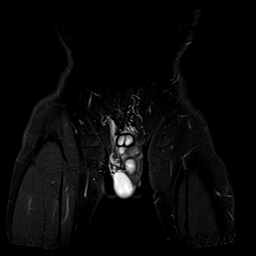

[Series 7: T2 fat-sat · sagittal · 4.0mm · 0.53mm/px · 5 of 70 slices shown (2 of 2)]
[im 1/70]
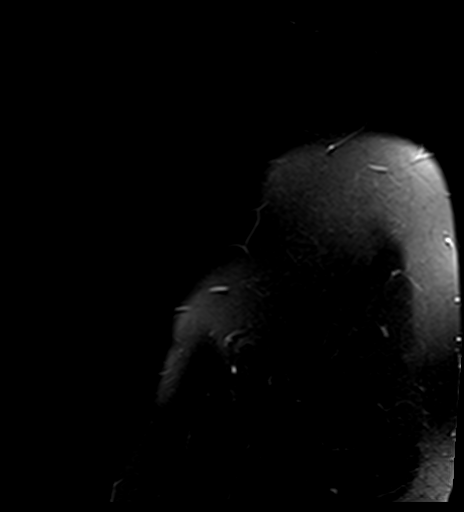
[im 11/70]
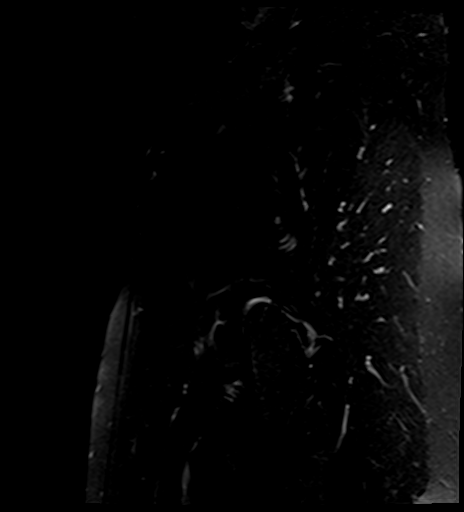
[im 22/70]
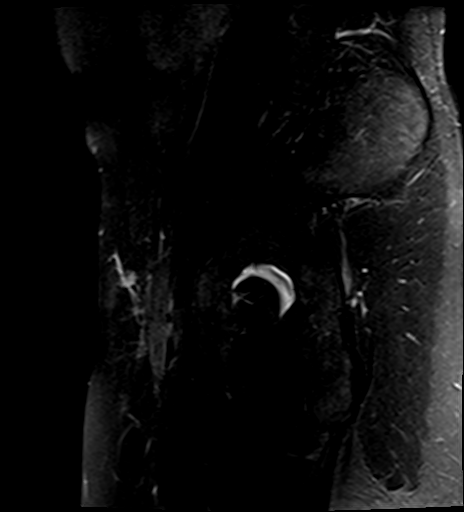
[im 32/70]
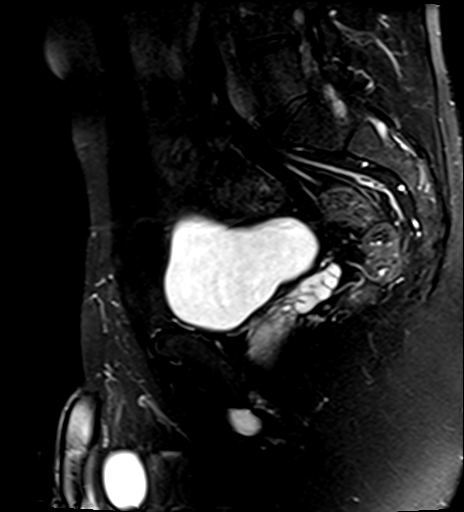
[im 38/70]
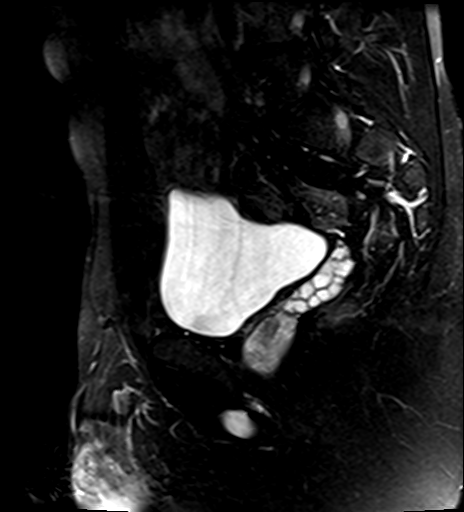

[35 of 48 positions shown; findings below may reference images not displayed]

FINDINGS: Bones:

No hip fracture, dislocation or avascular necrosis. No periosteal
reaction or bone destruction. No aggressive osseous lesion.

Normal sacrum and sacroiliac joints. No SI joint widening or erosive
changes. Degenerative disease with disc height loss at L4-5.

Articular cartilage and labrum

Articular cartilage:  No chondral defect.

Labrum: Limited evaluation of the labrum secondary to lack of
intra-articular fluid. Suggestion of superior anterior labral
degeneration.

Joint or bursal effusion

Joint effusion:  No hip joint effusion.  No SI joint effusion.

Bursae:  No bursa formation.

Muscles and tendons

Flexors: Normal.

Extensors: Normal.

Abductors: Normal.

Adductors: Normal.

Gluteals: Normal.

Hamstrings: Normal.

Other findings

No pelvic free fluid. No fluid collection or hematoma. No inguinal
lymphadenopathy. No inguinal hernia.
IMPRESSION: 1. No acute injury of the pelvis.
2. Limited evaluation of the labrum secondary to lack of
intra-articular fluid. Suggestion of superior anterior labral
degeneration.

## 2020-06-19 IMAGING — MR MR HIP*R* W/O CM
5 series · 40 of 40 positions shown · non-contrast
Comparison: None.

CLINICAL DATA: Right hip pain radiating down to the ankle for 5
years

EXAM:
MR OF THE RIGHT HIP WITHOUT CONTRAST
TECHNIQUE: Multiplanar, multisequence MR imaging was performed. No intravenous
contrast was administered.

[Series 1: T1 · coronal · 4.0mm · 1.25mm/px · 8 of 27 slices shown]
[im 1/27]
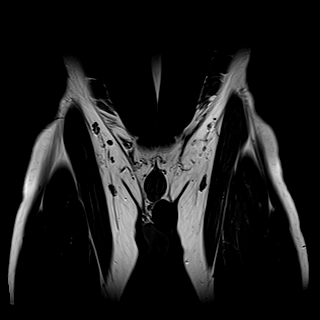
[im 4/27]
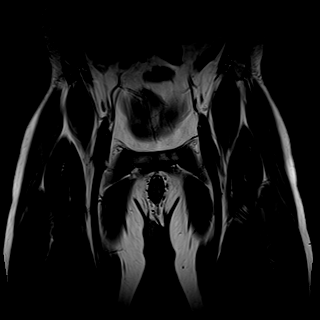
[im 8/27]
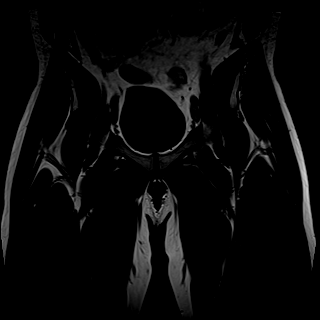
[im 12/27]
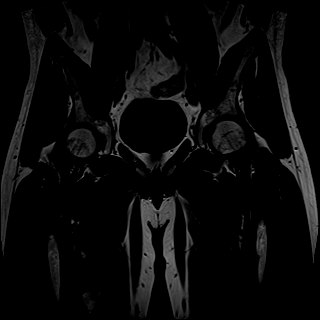
[im 15/27]
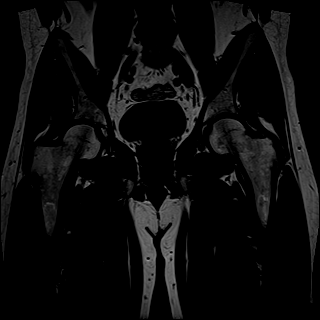
[im 19/27]
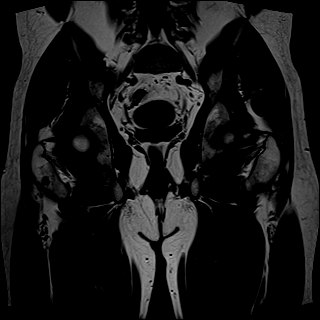
[im 23/27]
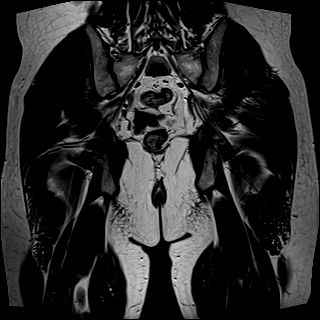
[im 27/27]
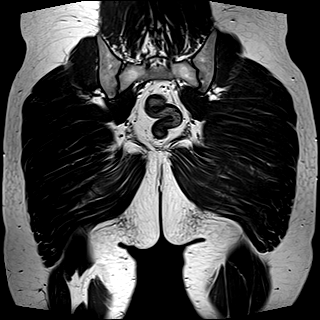

[Series 2: T2 fat-sat · coronal · 4.0mm · 1.48mm/px · 8 of 27 slices shown (1 of 2)]
[im 1/27]
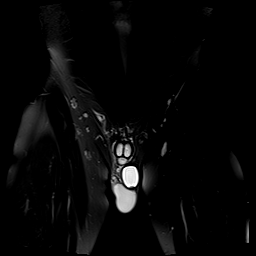
[im 4/27]
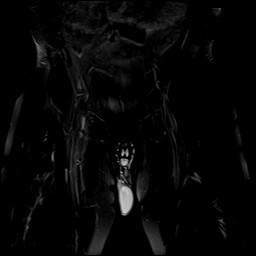
[im 8/27]
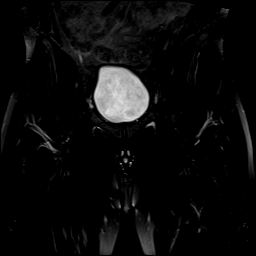
[im 12/27]
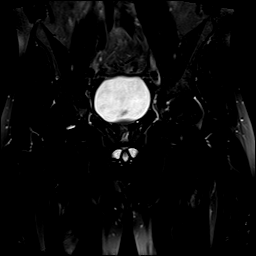
[im 15/27]
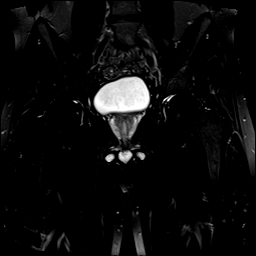
[im 19/27]
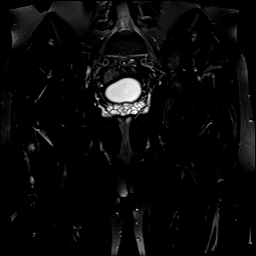
[im 23/27]
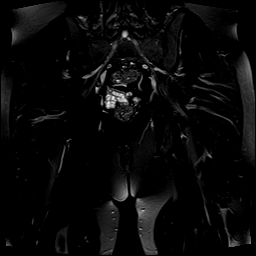
[im 27/27]
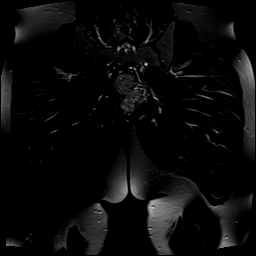

[Series 3: T2 fat-sat · axial · 4.0mm · 0.78mm/px · z∈[-69,+89]mm · 10 of 34 slices shown (2 of 2)]
[im 1/34]
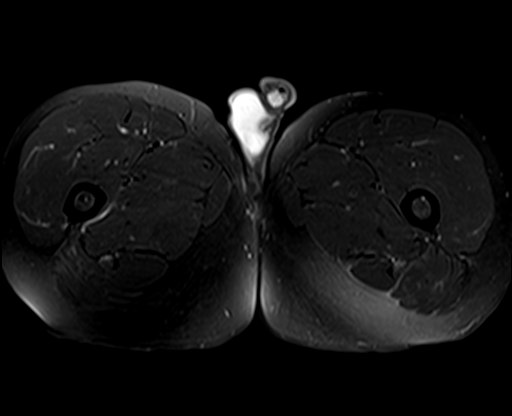
[im 4/34]
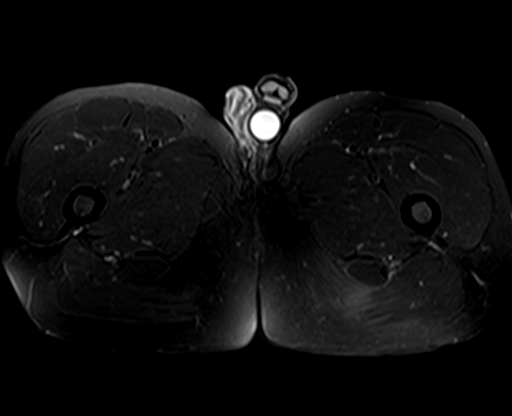
[im 8/34]
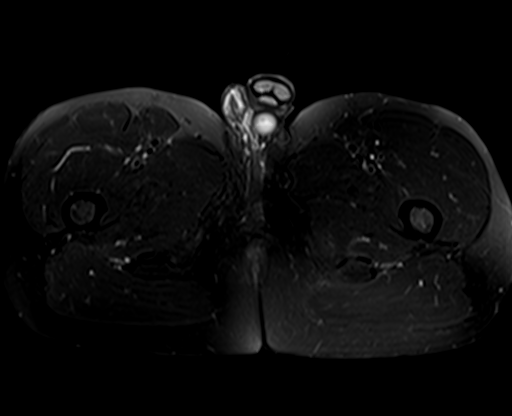
[im 12/34]
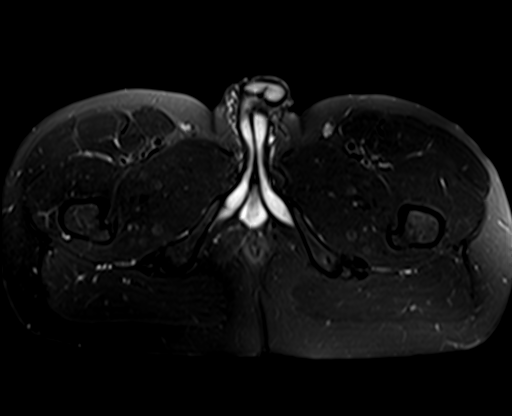
[im 15/34]
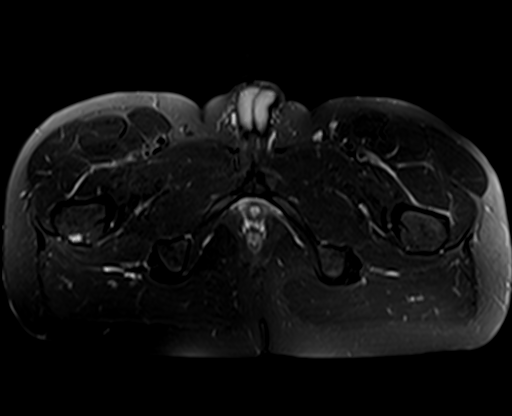
[im 19/34]
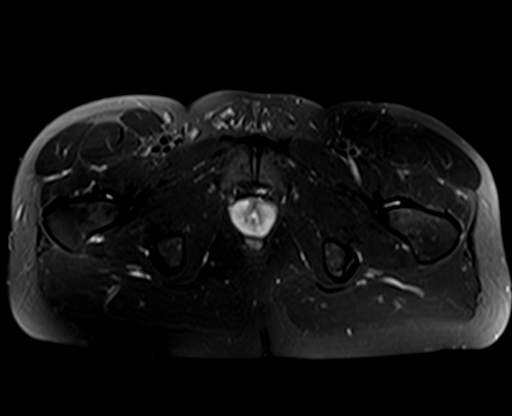
[im 23/34]
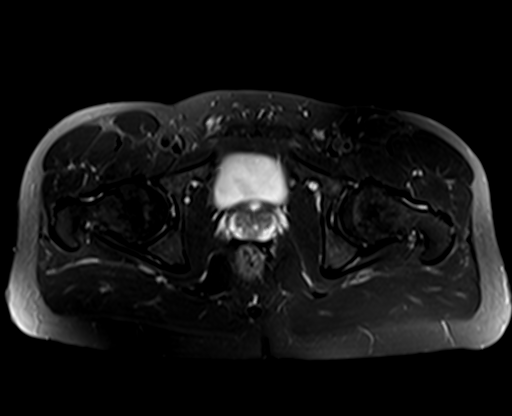
[im 26/34]
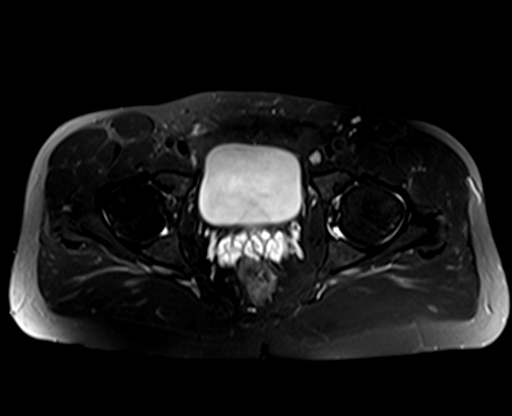
[im 30/34]
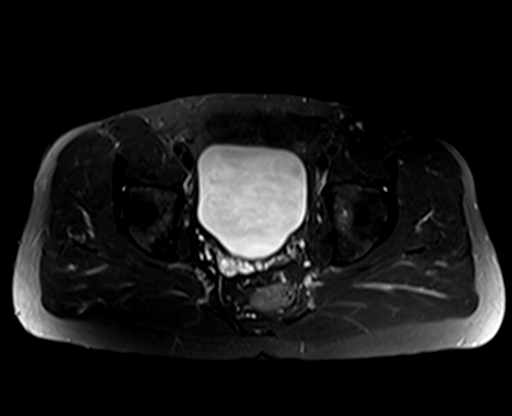
[im 34/34]
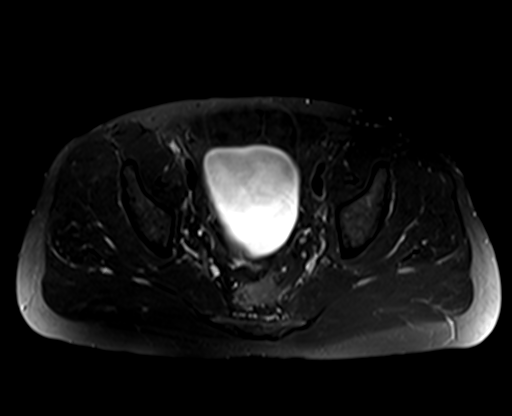

[Series 4: PD fat-sat · sagittal · 4.0mm · 0.74mm/px · 8 of 27 slices shown (1 of 2)]
[im 1/27]
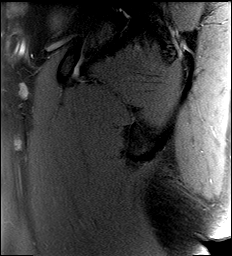
[im 4/27]
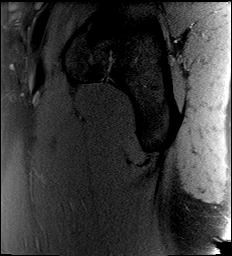
[im 8/27]
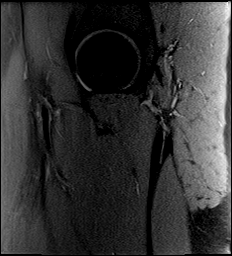
[im 12/27]
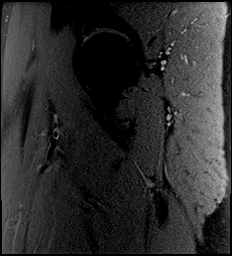
[im 15/27]
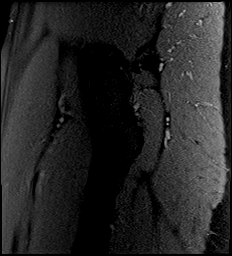
[im 19/27]
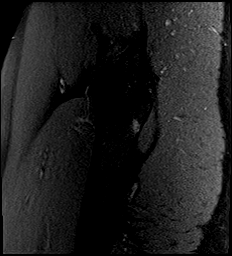
[im 23/27]
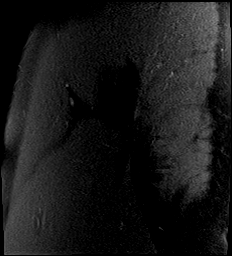
[im 27/27]
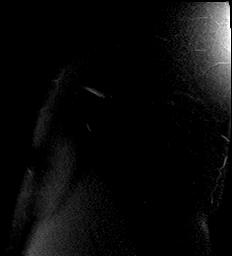

[Series 5: PD fat-sat · coronal · 4.0mm · 0.70mm/px · 6 of 21 slices shown (2 of 2)]
[im 1/21]
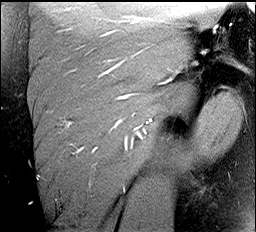
[im 5/21]
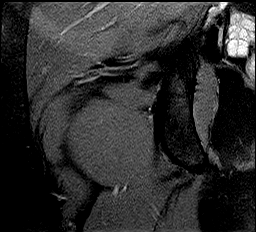
[im 9/21]
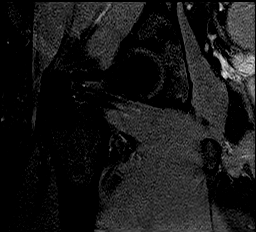
[im 13/21]
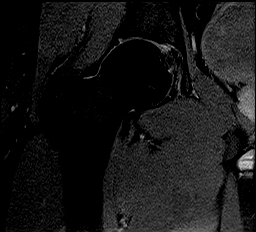
[im 17/21]
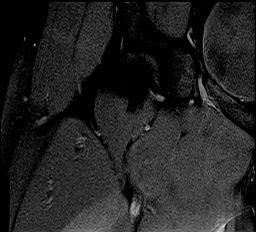
[im 21/21]
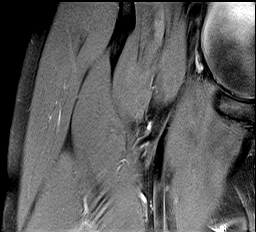

[40 of 40 positions shown; findings below may reference images not displayed]

FINDINGS: Bones:

No hip fracture, dislocation or avascular necrosis. No periosteal
reaction or bone destruction. No aggressive osseous lesion.

Normal sacrum and sacroiliac joints. No SI joint widening or erosive
changes.

Degenerative disease with disc height loss at L4-5 and L5-S1.

Articular cartilage and labrum

Articular cartilage: Partial-thickness cartilage loss of the right
femoral head and acetabulum.

Labrum:  Right superior labral tear.

Joint or bursal effusion

Joint effusion:  No hip joint effusion.  No SI joint effusion.

Bursae:  No bursa formation.

Muscles and tendons

Flexors: Normal.

Extensors: Normal.

Abductors: Normal.

Adductors: Normal.

Gluteals: Normal.

Hamstrings: Normal.

Other findings

No pelvic free fluid. No fluid collection or hematoma. No inguinal
lymphadenopathy. No inguinal hernia.
IMPRESSION: 1. Mild osteoarthritis of the right hip.
2. Right superior labral tear.

## 2020-06-22 ENCOUNTER — Ambulatory Visit: Payer: 59 | Admitting: Orthopaedic Surgery

## 2020-06-25 DIAGNOSIS — M5386 Other specified dorsopathies, lumbar region: Secondary | ICD-10-CM | POA: Diagnosis not present

## 2020-06-25 DIAGNOSIS — M62830 Muscle spasm of back: Secondary | ICD-10-CM | POA: Diagnosis not present

## 2020-06-25 DIAGNOSIS — M9902 Segmental and somatic dysfunction of thoracic region: Secondary | ICD-10-CM | POA: Diagnosis not present

## 2020-06-25 DIAGNOSIS — M9905 Segmental and somatic dysfunction of pelvic region: Secondary | ICD-10-CM | POA: Diagnosis not present

## 2020-06-25 DIAGNOSIS — M5137 Other intervertebral disc degeneration, lumbosacral region: Secondary | ICD-10-CM | POA: Diagnosis not present

## 2020-06-25 DIAGNOSIS — M9903 Segmental and somatic dysfunction of lumbar region: Secondary | ICD-10-CM | POA: Diagnosis not present

## 2020-06-28 ENCOUNTER — Ambulatory Visit (INDEPENDENT_AMBULATORY_CARE_PROVIDER_SITE_OTHER): Payer: 59

## 2020-06-28 ENCOUNTER — Encounter: Payer: Self-pay | Admitting: Orthopaedic Surgery

## 2020-06-28 ENCOUNTER — Ambulatory Visit: Payer: 59 | Admitting: Orthopaedic Surgery

## 2020-06-28 DIAGNOSIS — M25551 Pain in right hip: Secondary | ICD-10-CM | POA: Diagnosis not present

## 2020-06-28 DIAGNOSIS — G8929 Other chronic pain: Secondary | ICD-10-CM

## 2020-06-28 DIAGNOSIS — M25571 Pain in right ankle and joints of right foot: Secondary | ICD-10-CM

## 2020-06-28 DIAGNOSIS — M25561 Pain in right knee: Secondary | ICD-10-CM

## 2020-06-28 NOTE — Progress Notes (Signed)
Office Visit Note   Patient: Christopher Swanson           Date of Birth: April 25, 1986           MRN: [id number] Visit Date: 06/28/2020              Requested by: Wenda Low, MD [place] [employer] [address] [place],  [place] [address] PCP: Wenda Low, MD   Assessment & Plan: Visit Diagnoses:  1. Pain in right ankle and joints of right foot   2. Pain in right hip   3. Chronic pain of right knee     Plan: I would like to send him to [facility] for specialized care as a relates to physical therapy to look at this gentleman's gait and balance and coordination as well as assess how his exercise routine is to hopefully decrease the pain he experiences in his right lower extremity.  I recommended him avoid high impact aerobic activities.  He agrees with this treatment plan.  I would like to see him back in 2 months after a course of formal physical therapy.  Follow-Up Instructions: Return in about 2 months (around 08/29/2020).   Orders:  Orders Placed This Encounter  Procedures  . XR Ankle Complete Right   No orders of the defined types were placed in this encounter.     Procedures: No procedures performed   Clinical Data: No additional findings.   Subjective: Chief Complaint  Patient presents with  . Right Hip - Follow-up  The patient is a 34 year old gentleman with a complex issue as it relates to right lower extremity pain.  This has been going on for some time.  He is an athletic individual.  He has MRI findings of the lumbar spine showing some mild degenerative changes.  He also has a normal MRI of his right knee.  I sent him for an MRI of the right hip.  He is also requesting today x-rays of his right ankle.  The MRI of his right hip did show some mild degenerative changes with the superior degenerative labral tear and some partial cartilage loss of the superior aspect of the hip pole and acetabulum.  He feels like the knee bows and when he walks and is concerned about  his ankle.  He is an athletic individual.  A steroid taper did not help him whatsoever in terms of the daily pain that he gets.  HPI  Review of Systems There is currently no active medical problems otherwise.  Objective: Vital Signs: There were no vitals taken for this visit.  Physical Exam He is alert and orient x3 and in no acute distress Ortho Exam   examination is right hip shows that it moves smoothly and fluidly and he has no pain in the groin at all.  There is no pain on extremes of rotation.  His right knee and right ankle exams are entirely normal. Specialty Comments:  No specialty comments available.  Imaging: XR Ankle Complete Right  Result Date: 06/28/2020 3 views of the right ankle show a normal-appearing ankle with no acute findings.    PMFS History: Patient Active Problem List   Diagnosis Date Noted  . Right knee pain 09/19/2019  . Patellofemoral stress syndrome of left knee 07/29/2015  . Lumbar degenerative disc disease 02/16/2015  . Cancer of testis, seminoma postorchiectomy 12/16/2012  . ADD (attention deficit disorder) 07/31/2012   Past Medical History:  Diagnosis Date  . ADD (attention deficit disorder)   .  Testicular tumor    left    History reviewed. No pertinent family history.  Past Surgical History:  Procedure Laterality Date  . CIRCUMCISION  AGE 54  . ORCHIECTOMY Left 12/16/2012   Procedure: ORCHIECTOMY;  Surgeon: Bernestine Amass, MD;  Location: [facility];  Service: Urology;  Laterality: Left;  . TESTICULAR PROSTHETIC INSERTION Left 12/16/2012   Procedure: INSERTION TESTICULAR PROSTHESIS;  Surgeon: Bernestine Amass, MD;  Location: [facility];  Service: Urology;  Laterality: Left;  . WISDOM TOOTH EXTRACTION  AGE 34   Social History   Occupational History  . Not on file  Tobacco Use  . Smoking status: Never Smoker  . Smokeless tobacco: Never Used  Substance and Sexual Activity  . Alcohol use: Yes     Alcohol/week: 5.0 standard drinks    Types: 3 Cans of beer, 2 Glasses of wine per week    Comment: WEEKENDS  . Drug use: No  . Sexual activity: Not on file

## 2020-08-06 DIAGNOSIS — Z13220 Encounter for screening for lipoid disorders: Secondary | ICD-10-CM | POA: Diagnosis not present

## 2020-08-06 DIAGNOSIS — Z0000 Encounter for general adult medical examination without abnormal findings: Secondary | ICD-10-CM | POA: Diagnosis not present

## 2020-08-06 DIAGNOSIS — R69 Illness, unspecified: Secondary | ICD-10-CM | POA: Diagnosis not present

## 2020-08-06 DIAGNOSIS — Z8547 Personal history of malignant neoplasm of testis: Secondary | ICD-10-CM | POA: Diagnosis not present

## 2020-08-06 DIAGNOSIS — Z131 Encounter for screening for diabetes mellitus: Secondary | ICD-10-CM | POA: Diagnosis not present

## 2020-08-06 DIAGNOSIS — J309 Allergic rhinitis, unspecified: Secondary | ICD-10-CM | POA: Diagnosis not present

## 2020-08-06 DIAGNOSIS — M519 Unspecified thoracic, thoracolumbar and lumbosacral intervertebral disc disorder: Secondary | ICD-10-CM | POA: Diagnosis not present

## 2020-08-06 DIAGNOSIS — Z1389 Encounter for screening for other disorder: Secondary | ICD-10-CM | POA: Diagnosis not present

## 2020-08-10 DIAGNOSIS — L820 Inflamed seborrheic keratosis: Secondary | ICD-10-CM | POA: Diagnosis not present

## 2020-08-10 DIAGNOSIS — D2262 Melanocytic nevi of left upper limb, including shoulder: Secondary | ICD-10-CM | POA: Diagnosis not present

## 2020-08-10 DIAGNOSIS — D2221 Melanocytic nevi of right ear and external auricular canal: Secondary | ICD-10-CM | POA: Diagnosis not present

## 2020-08-10 DIAGNOSIS — D2272 Melanocytic nevi of left lower limb, including hip: Secondary | ICD-10-CM | POA: Diagnosis not present

## 2020-08-10 DIAGNOSIS — D224 Melanocytic nevi of scalp and neck: Secondary | ICD-10-CM | POA: Diagnosis not present

## 2020-08-10 DIAGNOSIS — Z86018 Personal history of other benign neoplasm: Secondary | ICD-10-CM | POA: Diagnosis not present

## 2020-08-10 DIAGNOSIS — Z808 Family history of malignant neoplasm of other organs or systems: Secondary | ICD-10-CM | POA: Diagnosis not present

## 2020-08-10 DIAGNOSIS — D2261 Melanocytic nevi of right upper limb, including shoulder: Secondary | ICD-10-CM | POA: Diagnosis not present

## 2020-08-10 DIAGNOSIS — L578 Other skin changes due to chronic exposure to nonionizing radiation: Secondary | ICD-10-CM | POA: Diagnosis not present

## 2020-08-10 DIAGNOSIS — D225 Melanocytic nevi of trunk: Secondary | ICD-10-CM | POA: Diagnosis not present

## 2020-08-10 DIAGNOSIS — D485 Neoplasm of uncertain behavior of skin: Secondary | ICD-10-CM | POA: Diagnosis not present

## 2020-08-11 DIAGNOSIS — M5386 Other specified dorsopathies, lumbar region: Secondary | ICD-10-CM | POA: Diagnosis not present

## 2020-08-11 DIAGNOSIS — M9905 Segmental and somatic dysfunction of pelvic region: Secondary | ICD-10-CM | POA: Diagnosis not present

## 2020-08-11 DIAGNOSIS — M9902 Segmental and somatic dysfunction of thoracic region: Secondary | ICD-10-CM | POA: Diagnosis not present

## 2020-08-11 DIAGNOSIS — M9903 Segmental and somatic dysfunction of lumbar region: Secondary | ICD-10-CM | POA: Diagnosis not present

## 2020-08-11 DIAGNOSIS — M62830 Muscle spasm of back: Secondary | ICD-10-CM | POA: Diagnosis not present

## 2020-08-11 DIAGNOSIS — M5137 Other intervertebral disc degeneration, lumbosacral region: Secondary | ICD-10-CM | POA: Diagnosis not present

## 2020-08-30 ENCOUNTER — Ambulatory Visit: Payer: 59 | Admitting: Orthopaedic Surgery

## 2020-09-27 ENCOUNTER — Ambulatory Visit: Payer: No Typology Code available for payment source | Admitting: Orthopaedic Surgery

## 2020-11-03 DIAGNOSIS — M62830 Muscle spasm of back: Secondary | ICD-10-CM | POA: Diagnosis not present

## 2020-11-03 DIAGNOSIS — M9905 Segmental and somatic dysfunction of pelvic region: Secondary | ICD-10-CM | POA: Diagnosis not present

## 2020-11-03 DIAGNOSIS — M9902 Segmental and somatic dysfunction of thoracic region: Secondary | ICD-10-CM | POA: Diagnosis not present

## 2020-11-03 DIAGNOSIS — M9903 Segmental and somatic dysfunction of lumbar region: Secondary | ICD-10-CM | POA: Diagnosis not present

## 2020-11-03 DIAGNOSIS — M5137 Other intervertebral disc degeneration, lumbosacral region: Secondary | ICD-10-CM | POA: Diagnosis not present

## 2020-11-03 DIAGNOSIS — M5386 Other specified dorsopathies, lumbar region: Secondary | ICD-10-CM | POA: Diagnosis not present

## 2020-12-07 DIAGNOSIS — M9903 Segmental and somatic dysfunction of lumbar region: Secondary | ICD-10-CM | POA: Diagnosis not present

## 2020-12-07 DIAGNOSIS — M5137 Other intervertebral disc degeneration, lumbosacral region: Secondary | ICD-10-CM | POA: Diagnosis not present

## 2020-12-07 DIAGNOSIS — M62830 Muscle spasm of back: Secondary | ICD-10-CM | POA: Diagnosis not present

## 2020-12-07 DIAGNOSIS — M5386 Other specified dorsopathies, lumbar region: Secondary | ICD-10-CM | POA: Diagnosis not present

## 2020-12-07 DIAGNOSIS — M9902 Segmental and somatic dysfunction of thoracic region: Secondary | ICD-10-CM | POA: Diagnosis not present

## 2020-12-07 DIAGNOSIS — M9905 Segmental and somatic dysfunction of pelvic region: Secondary | ICD-10-CM | POA: Diagnosis not present

## 2021-01-21 DIAGNOSIS — M62830 Muscle spasm of back: Secondary | ICD-10-CM | POA: Diagnosis not present

## 2021-01-21 DIAGNOSIS — M5137 Other intervertebral disc degeneration, lumbosacral region: Secondary | ICD-10-CM | POA: Diagnosis not present

## 2021-01-21 DIAGNOSIS — M5386 Other specified dorsopathies, lumbar region: Secondary | ICD-10-CM | POA: Diagnosis not present

## 2021-01-21 DIAGNOSIS — M9902 Segmental and somatic dysfunction of thoracic region: Secondary | ICD-10-CM | POA: Diagnosis not present

## 2021-01-21 DIAGNOSIS — M9903 Segmental and somatic dysfunction of lumbar region: Secondary | ICD-10-CM | POA: Diagnosis not present

## 2021-01-21 DIAGNOSIS — M9905 Segmental and somatic dysfunction of pelvic region: Secondary | ICD-10-CM | POA: Diagnosis not present

## 2021-02-25 ENCOUNTER — Ambulatory Visit: Payer: 59 | Admitting: Sports Medicine

## 2021-02-25 ENCOUNTER — Other Ambulatory Visit: Payer: Self-pay

## 2021-02-25 DIAGNOSIS — M5136 Other intervertebral disc degeneration, lumbar region: Secondary | ICD-10-CM | POA: Diagnosis not present

## 2021-02-25 DIAGNOSIS — M25561 Pain in right knee: Secondary | ICD-10-CM

## 2021-02-25 DIAGNOSIS — M222X2 Patellofemoral disorders, left knee: Secondary | ICD-10-CM | POA: Diagnosis not present

## 2021-02-25 DIAGNOSIS — G8929 Other chronic pain: Secondary | ICD-10-CM | POA: Diagnosis not present

## 2021-02-25 DIAGNOSIS — M51369 Other intervertebral disc degeneration, lumbar region without mention of lumbar back pain or lower extremity pain: Secondary | ICD-10-CM

## 2021-02-25 MED ORDER — PREDNISONE 50 MG PO TABS: ORAL_TABLET | ORAL | 0 refills | Status: AC

## 2021-02-25 MED ORDER — MELOXICAM 15 MG PO TABS: 15.0000 mg | ORAL_TABLET | Freq: Every day | ORAL | 3 refills | Status: AC | PRN

## 2021-02-25 MED ORDER — CYCLOBENZAPRINE HCL 10 MG PO TABS: 10.0000 mg | ORAL_TABLET | Freq: Two times a day (BID) | ORAL | 3 refills | Status: AC | PRN

## 2021-02-25 NOTE — Progress Notes (Signed)
    Procedures performed today:    None.  Independent interpretation of notes and tests performed by another provider:   None.  Brief History, Exam, Impression, and Recommendations:    Lumbar degenerative disc disease Shaffer returns, he is a very pleasant 35 year old male, chronic low back pain, mostly axial and discogenic, and MRI does show L4-S1 DDD. Historically he has done well with meloxicam and Flexeril. I have not seen him in a while so we will refill his meloxicam, Flexeril, add some home conditioning exercises. We will start also with 5 days of prednisone to give him a jumpstart. He understands that he really needs to work on his core, and if none of the above works we will proceed with updated MRI and an epidural.  Right knee pain Chronic anterolateral and medial right knee pain, MRI did show mild osteoarthritis, he has historically been well controlled with meloxicam, refilling this for now. He understands that the next step if this fails would be injections, and he would desire to start with viscosupplementation rather than a steroid injection.    ___________________________________________ Gwen Her. Dianah Field, M.D., ABFM., CAQSM. Primary Care and [place] Instructor of [employer] [facility]

## 2021-02-25 NOTE — Assessment & Plan Note (Signed)
Chronic anterolateral and medial right knee pain, MRI did show mild osteoarthritis, he has historically been well controlled with meloxicam, refilling this for now. He understands that the next step if this fails would be injections, and he would desire to start with viscosupplementation rather than a steroid injection.

## 2021-02-25 NOTE — Assessment & Plan Note (Signed)
Christopher Swanson returns, he is a very pleasant 35 year old male, chronic low back pain, mostly axial and discogenic, and MRI does show L4-S1 DDD. Historically he has done well with meloxicam and Flexeril. I have not seen him in a while so we will refill his meloxicam, Flexeril, add some home conditioning exercises. We will start also with 5 days of prednisone to give him a jumpstart. He understands that he really needs to work on his core, and if none of the above works we will proceed with updated MRI and an epidural.

## 2021-03-25 DIAGNOSIS — M5137 Other intervertebral disc degeneration, lumbosacral region: Secondary | ICD-10-CM | POA: Diagnosis not present

## 2021-03-25 DIAGNOSIS — M62830 Muscle spasm of back: Secondary | ICD-10-CM | POA: Diagnosis not present

## 2021-03-25 DIAGNOSIS — M5386 Other specified dorsopathies, lumbar region: Secondary | ICD-10-CM | POA: Diagnosis not present

## 2021-03-25 DIAGNOSIS — M9905 Segmental and somatic dysfunction of pelvic region: Secondary | ICD-10-CM | POA: Diagnosis not present

## 2021-03-25 DIAGNOSIS — M9902 Segmental and somatic dysfunction of thoracic region: Secondary | ICD-10-CM | POA: Diagnosis not present

## 2021-03-25 DIAGNOSIS — M9903 Segmental and somatic dysfunction of lumbar region: Secondary | ICD-10-CM | POA: Diagnosis not present

## 2021-04-10 DIAGNOSIS — Z23 Encounter for immunization: Secondary | ICD-10-CM | POA: Diagnosis not present

## 2021-05-19 DIAGNOSIS — H109 Unspecified conjunctivitis: Secondary | ICD-10-CM | POA: Insufficient documentation

## 2021-05-19 MED ORDER — MOXIFLOXACIN HCL 0.5 % OP SOLN: 1.0000 [drp] | Freq: Three times a day (TID) | OPHTHALMIC | 0 refills | Status: AC

## 2021-05-19 NOTE — Assessment & Plan Note (Signed)
Baby has conjunctivitis, mother and father also developed similar symptoms, adding moxifloxacin per their request.

## 2021-08-10 DIAGNOSIS — Z1389 Encounter for screening for other disorder: Secondary | ICD-10-CM | POA: Diagnosis not present

## 2021-08-10 DIAGNOSIS — Z13220 Encounter for screening for lipoid disorders: Secondary | ICD-10-CM | POA: Diagnosis not present

## 2021-08-10 DIAGNOSIS — R69 Illness, unspecified: Secondary | ICD-10-CM | POA: Diagnosis not present

## 2021-08-10 DIAGNOSIS — Z0000 Encounter for general adult medical examination without abnormal findings: Secondary | ICD-10-CM | POA: Diagnosis not present

## 2021-08-10 DIAGNOSIS — J309 Allergic rhinitis, unspecified: Secondary | ICD-10-CM | POA: Diagnosis not present

## 2021-08-10 DIAGNOSIS — M519 Unspecified thoracic, thoracolumbar and lumbosacral intervertebral disc disorder: Secondary | ICD-10-CM | POA: Diagnosis not present

## 2021-08-10 DIAGNOSIS — Z8547 Personal history of malignant neoplasm of testis: Secondary | ICD-10-CM | POA: Diagnosis not present

## 2021-08-10 DIAGNOSIS — Z79899 Other long term (current) drug therapy: Secondary | ICD-10-CM | POA: Diagnosis not present

## 2021-08-12 DIAGNOSIS — D2272 Melanocytic nevi of left lower limb, including hip: Secondary | ICD-10-CM | POA: Diagnosis not present

## 2021-08-12 DIAGNOSIS — D2221 Melanocytic nevi of right ear and external auricular canal: Secondary | ICD-10-CM | POA: Diagnosis not present

## 2021-08-12 DIAGNOSIS — L578 Other skin changes due to chronic exposure to nonionizing radiation: Secondary | ICD-10-CM | POA: Diagnosis not present

## 2021-08-12 DIAGNOSIS — Z86018 Personal history of other benign neoplasm: Secondary | ICD-10-CM | POA: Diagnosis not present

## 2021-08-12 DIAGNOSIS — D225 Melanocytic nevi of trunk: Secondary | ICD-10-CM | POA: Diagnosis not present

## 2021-08-12 DIAGNOSIS — D224 Melanocytic nevi of scalp and neck: Secondary | ICD-10-CM | POA: Diagnosis not present

## 2021-08-12 DIAGNOSIS — D2262 Melanocytic nevi of left upper limb, including shoulder: Secondary | ICD-10-CM | POA: Diagnosis not present

## 2021-08-12 DIAGNOSIS — D485 Neoplasm of uncertain behavior of skin: Secondary | ICD-10-CM | POA: Diagnosis not present

## 2021-08-12 DIAGNOSIS — Z808 Family history of malignant neoplasm of other organs or systems: Secondary | ICD-10-CM | POA: Diagnosis not present

## 2021-08-12 DIAGNOSIS — D2261 Melanocytic nevi of right upper limb, including shoulder: Secondary | ICD-10-CM | POA: Diagnosis not present

## 2021-08-21 DIAGNOSIS — H10029 Other mucopurulent conjunctivitis, unspecified eye: Secondary | ICD-10-CM | POA: Diagnosis not present

## 2021-09-08 DIAGNOSIS — M545 Low back pain, unspecified: Secondary | ICD-10-CM | POA: Diagnosis not present

## 2021-10-13 DIAGNOSIS — S83241A Other tear of medial meniscus, current injury, right knee, initial encounter: Secondary | ICD-10-CM | POA: Diagnosis not present

## 2021-10-29 DIAGNOSIS — M25561 Pain in right knee: Secondary | ICD-10-CM | POA: Diagnosis not present

## 2021-11-02 DIAGNOSIS — M25561 Pain in right knee: Secondary | ICD-10-CM | POA: Diagnosis not present

## 2021-11-23 DIAGNOSIS — M6281 Muscle weakness (generalized): Secondary | ICD-10-CM | POA: Diagnosis not present

## 2021-11-23 DIAGNOSIS — M25561 Pain in right knee: Secondary | ICD-10-CM | POA: Diagnosis not present

## 2021-11-28 DIAGNOSIS — M6281 Muscle weakness (generalized): Secondary | ICD-10-CM | POA: Diagnosis not present

## 2021-11-28 DIAGNOSIS — M25561 Pain in right knee: Secondary | ICD-10-CM | POA: Diagnosis not present

## 2021-11-30 DIAGNOSIS — M25561 Pain in right knee: Secondary | ICD-10-CM | POA: Diagnosis not present

## 2021-11-30 DIAGNOSIS — M6281 Muscle weakness (generalized): Secondary | ICD-10-CM | POA: Diagnosis not present

## 2021-12-15 DIAGNOSIS — M25561 Pain in right knee: Secondary | ICD-10-CM | POA: Diagnosis not present

## 2021-12-15 DIAGNOSIS — M6281 Muscle weakness (generalized): Secondary | ICD-10-CM | POA: Diagnosis not present

## 2022-02-17 DIAGNOSIS — M25561 Pain in right knee: Secondary | ICD-10-CM | POA: Diagnosis not present

## 2022-02-27 ENCOUNTER — Other Ambulatory Visit: Payer: Self-pay | Admitting: Sports Medicine

## 2022-02-27 DIAGNOSIS — M5136 Other intervertebral disc degeneration, lumbar region: Secondary | ICD-10-CM

## 2022-02-27 DIAGNOSIS — M222X2 Patellofemoral disorders, left knee: Secondary | ICD-10-CM

## 2022-08-18 DIAGNOSIS — D225 Melanocytic nevi of trunk: Secondary | ICD-10-CM | POA: Diagnosis not present

## 2022-08-18 DIAGNOSIS — L821 Other seborrheic keratosis: Secondary | ICD-10-CM | POA: Diagnosis not present

## 2022-08-18 DIAGNOSIS — D2261 Melanocytic nevi of right upper limb, including shoulder: Secondary | ICD-10-CM | POA: Diagnosis not present

## 2022-08-18 DIAGNOSIS — D485 Neoplasm of uncertain behavior of skin: Secondary | ICD-10-CM | POA: Diagnosis not present

## 2022-08-18 DIAGNOSIS — L578 Other skin changes due to chronic exposure to nonionizing radiation: Secondary | ICD-10-CM | POA: Diagnosis not present

## 2022-08-18 DIAGNOSIS — D2272 Melanocytic nevi of left lower limb, including hip: Secondary | ICD-10-CM | POA: Diagnosis not present

## 2022-09-15 DIAGNOSIS — J309 Allergic rhinitis, unspecified: Secondary | ICD-10-CM | POA: Diagnosis not present

## 2022-09-15 DIAGNOSIS — M519 Unspecified thoracic, thoracolumbar and lumbosacral intervertebral disc disorder: Secondary | ICD-10-CM | POA: Diagnosis not present

## 2022-09-15 DIAGNOSIS — Z8547 Personal history of malignant neoplasm of testis: Secondary | ICD-10-CM | POA: Diagnosis not present

## 2022-09-15 DIAGNOSIS — Z13220 Encounter for screening for lipoid disorders: Secondary | ICD-10-CM | POA: Diagnosis not present

## 2022-09-15 DIAGNOSIS — F411 Generalized anxiety disorder: Secondary | ICD-10-CM | POA: Diagnosis not present

## 2022-09-15 DIAGNOSIS — Z1389 Encounter for screening for other disorder: Secondary | ICD-10-CM | POA: Diagnosis not present

## 2022-09-15 DIAGNOSIS — Z0000 Encounter for general adult medical examination without abnormal findings: Secondary | ICD-10-CM | POA: Diagnosis not present

## 2022-10-26 DIAGNOSIS — D485 Neoplasm of uncertain behavior of skin: Secondary | ICD-10-CM | POA: Diagnosis not present

## 2023-04-09 DIAGNOSIS — M5126 Other intervertebral disc displacement, lumbar region: Secondary | ICD-10-CM | POA: Diagnosis not present

## 2023-04-09 DIAGNOSIS — M5416 Radiculopathy, lumbar region: Secondary | ICD-10-CM | POA: Diagnosis not present

## 2023-04-24 DIAGNOSIS — M545 Low back pain, unspecified: Secondary | ICD-10-CM | POA: Diagnosis not present

## 2023-05-21 DIAGNOSIS — M545 Low back pain, unspecified: Secondary | ICD-10-CM | POA: Diagnosis not present

## 2023-05-31 DIAGNOSIS — M5416 Radiculopathy, lumbar region: Secondary | ICD-10-CM | POA: Diagnosis not present

## 2023-06-25 DIAGNOSIS — M5416 Radiculopathy, lumbar region: Secondary | ICD-10-CM | POA: Diagnosis not present

## 2023-09-17 DIAGNOSIS — J1089 Influenza due to other identified influenza virus with other manifestations: Secondary | ICD-10-CM | POA: Diagnosis not present

## 2023-09-21 DIAGNOSIS — Z8547 Personal history of malignant neoplasm of testis: Secondary | ICD-10-CM | POA: Diagnosis not present

## 2023-09-21 DIAGNOSIS — Z0000 Encounter for general adult medical examination without abnormal findings: Secondary | ICD-10-CM | POA: Diagnosis not present

## 2023-09-21 DIAGNOSIS — F419 Anxiety disorder, unspecified: Secondary | ICD-10-CM | POA: Diagnosis not present

## 2023-09-21 DIAGNOSIS — F900 Attention-deficit hyperactivity disorder, predominantly inattentive type: Secondary | ICD-10-CM | POA: Diagnosis not present

## 2023-11-05 DIAGNOSIS — G8929 Other chronic pain: Secondary | ICD-10-CM | POA: Diagnosis not present

## 2023-11-05 DIAGNOSIS — M545 Low back pain, unspecified: Secondary | ICD-10-CM | POA: Diagnosis not present

## 2023-11-16 DIAGNOSIS — M5416 Radiculopathy, lumbar region: Secondary | ICD-10-CM | POA: Diagnosis not present

## 2023-12-07 DIAGNOSIS — Z8547 Personal history of malignant neoplasm of testis: Secondary | ICD-10-CM | POA: Diagnosis not present

## 2023-12-10 DIAGNOSIS — M5416 Radiculopathy, lumbar region: Secondary | ICD-10-CM | POA: Diagnosis not present

## 2024-02-01 DIAGNOSIS — L910 Hypertrophic scar: Secondary | ICD-10-CM | POA: Diagnosis not present

## 2024-02-01 DIAGNOSIS — D485 Neoplasm of uncertain behavior of skin: Secondary | ICD-10-CM | POA: Diagnosis not present

## 2024-02-01 DIAGNOSIS — L578 Other skin changes due to chronic exposure to nonionizing radiation: Secondary | ICD-10-CM | POA: Diagnosis not present

## 2024-02-01 DIAGNOSIS — D225 Melanocytic nevi of trunk: Secondary | ICD-10-CM | POA: Diagnosis not present

## 2024-02-01 DIAGNOSIS — D2262 Melanocytic nevi of left upper limb, including shoulder: Secondary | ICD-10-CM | POA: Diagnosis not present

## 2024-02-01 DIAGNOSIS — D2239 Melanocytic nevi of other parts of face: Secondary | ICD-10-CM | POA: Diagnosis not present
